# Patient Record
Sex: Female | Born: 1980 | Race: Black or African American | Hispanic: No | Marital: Single | Smoking: Never smoker
Health system: Southern US, Community
[De-identification: ages and names within clinical notes are randomized; demographics above are authoritative.]

## PROBLEM LIST (undated history)

## (undated) DIAGNOSIS — D649 Anemia, unspecified: Secondary | ICD-10-CM

## (undated) DIAGNOSIS — I10 Essential (primary) hypertension: Secondary | ICD-10-CM

## (undated) HISTORY — PX: HIP SURGERY: SHX245

## (undated) HISTORY — DX: Anemia, unspecified: D64.9

---

## 2005-08-23 ENCOUNTER — Emergency Department (HOSPITAL_COMMUNITY): Admission: EM | Admit: 2005-08-23 | Discharge: 2005-08-23 | Payer: Self-pay | Admitting: Emergency Medicine

## 2006-06-07 ENCOUNTER — Emergency Department (HOSPITAL_COMMUNITY): Admission: EM | Admit: 2006-06-07 | Discharge: 2006-06-07 | Payer: Self-pay | Admitting: Emergency Medicine

## 2006-12-13 ENCOUNTER — Emergency Department (HOSPITAL_COMMUNITY): Admission: EM | Admit: 2006-12-13 | Discharge: 2006-12-13 | Payer: Self-pay | Admitting: Emergency Medicine

## 2007-03-12 ENCOUNTER — Emergency Department (HOSPITAL_COMMUNITY): Admission: EM | Admit: 2007-03-12 | Discharge: 2007-03-13 | Payer: Self-pay | Admitting: Emergency Medicine

## 2010-02-19 ENCOUNTER — Ambulatory Visit (HOSPITAL_COMMUNITY): Admission: RE | Admit: 2010-02-19 | Discharge: 2010-02-19 | Payer: Self-pay | Admitting: Obstetrics and Gynecology

## 2010-03-02 ENCOUNTER — Ambulatory Visit (HOSPITAL_COMMUNITY): Admission: RE | Admit: 2010-03-02 | Discharge: 2010-03-02 | Payer: Self-pay | Admitting: Obstetrics and Gynecology

## 2010-03-16 ENCOUNTER — Ambulatory Visit (HOSPITAL_COMMUNITY): Admission: RE | Admit: 2010-03-16 | Discharge: 2010-03-16 | Payer: Self-pay | Admitting: Obstetrics and Gynecology

## 2010-04-08 ENCOUNTER — Ambulatory Visit (HOSPITAL_COMMUNITY): Admission: RE | Admit: 2010-04-08 | Discharge: 2010-04-08 | Payer: Self-pay | Admitting: Obstetrics and Gynecology

## 2010-05-06 ENCOUNTER — Ambulatory Visit (HOSPITAL_COMMUNITY): Admission: RE | Admit: 2010-05-06 | Discharge: 2010-05-06 | Payer: Self-pay | Admitting: Obstetrics and Gynecology

## 2010-06-17 ENCOUNTER — Ambulatory Visit (HOSPITAL_COMMUNITY): Admission: RE | Admit: 2010-06-17 | Discharge: 2010-06-17 | Payer: Self-pay | Admitting: Obstetrics and Gynecology

## 2010-06-19 ENCOUNTER — Emergency Department (HOSPITAL_COMMUNITY): Admission: EM | Admit: 2010-06-19 | Discharge: 2010-06-19 | Payer: Self-pay | Admitting: Emergency Medicine

## 2010-07-08 ENCOUNTER — Ambulatory Visit (HOSPITAL_COMMUNITY): Admission: RE | Admit: 2010-07-08 | Discharge: 2010-07-08 | Payer: Self-pay | Admitting: Obstetrics and Gynecology

## 2010-08-05 ENCOUNTER — Ambulatory Visit (HOSPITAL_COMMUNITY): Admission: RE | Admit: 2010-08-05 | Discharge: 2010-08-05 | Payer: Self-pay | Admitting: Obstetrics and Gynecology

## 2010-08-13 ENCOUNTER — Inpatient Hospital Stay (HOSPITAL_COMMUNITY): Admission: AD | Admit: 2010-08-13 | Discharge: 2010-08-13 | Payer: Self-pay | Admitting: Obstetrics and Gynecology

## 2010-08-22 ENCOUNTER — Inpatient Hospital Stay (HOSPITAL_COMMUNITY): Admission: AD | Admit: 2010-08-22 | Discharge: 2010-08-22 | Payer: Self-pay | Admitting: Obstetrics and Gynecology

## 2010-08-23 ENCOUNTER — Ambulatory Visit (HOSPITAL_COMMUNITY): Admission: RE | Admit: 2010-08-23 | Discharge: 2010-08-23 | Payer: Self-pay | Admitting: Obstetrics and Gynecology

## 2010-08-24 ENCOUNTER — Inpatient Hospital Stay (HOSPITAL_COMMUNITY)
Admission: AD | Admit: 2010-08-24 | Discharge: 2010-08-29 | Payer: Self-pay | Source: Home / Self Care | Admitting: Obstetrics and Gynecology

## 2010-08-26 ENCOUNTER — Encounter: Payer: Self-pay | Admitting: Obstetrics and Gynecology

## 2010-08-26 ENCOUNTER — Encounter (INDEPENDENT_AMBULATORY_CARE_PROVIDER_SITE_OTHER): Payer: Self-pay | Admitting: Obstetrics and Gynecology

## 2010-11-28 ENCOUNTER — Encounter: Payer: Self-pay | Admitting: Obstetrics and Gynecology

## 2010-11-29 ENCOUNTER — Encounter: Payer: Self-pay | Admitting: Obstetrics and Gynecology

## 2011-01-20 LAB — COMPREHENSIVE METABOLIC PANEL
ALT: 13 U/L (ref 0–35)
ALT: 13 U/L (ref 0–35)
ALT: 13 U/L (ref 0–35)
AST: 16 U/L (ref 0–37)
AST: 16 U/L (ref 0–37)
Albumin: 2.6 g/dL — ABNORMAL LOW (ref 3.5–5.2)
Albumin: 2.6 g/dL — ABNORMAL LOW (ref 3.5–5.2)
Albumin: 2.6 g/dL — ABNORMAL LOW (ref 3.5–5.2)
Alkaline Phosphatase: 89 U/L (ref 39–117)
Alkaline Phosphatase: 89 U/L (ref 39–117)
Alkaline Phosphatase: 89 U/L (ref 39–117)
BUN: 5 mg/dL — ABNORMAL LOW (ref 6–23)
BUN: 5 mg/dL — ABNORMAL LOW (ref 6–23)
BUN: 7 mg/dL (ref 6–23)
CO2: 24 mEq/L (ref 19–32)
CO2: 25 mEq/L (ref 19–32)
Calcium: 9.1 mg/dL (ref 8.4–10.5)
Calcium: 9.5 mg/dL (ref 8.4–10.5)
Chloride: 102 mEq/L (ref 96–112)
Chloride: 105 mEq/L (ref 96–112)
Chloride: 105 mEq/L (ref 96–112)
Creatinine, Ser: 0.59 mg/dL (ref 0.4–1.2)
Creatinine, Ser: 0.64 mg/dL (ref 0.4–1.2)
GFR calc Af Amer: >60 mL/min (ref >60)
GFR calc Af Amer: >60 mL/min (ref >60)
GFR calc non Af Amer: >60 mL/min (ref >60)
GFR calc non Af Amer: >60 mL/min (ref >60)
Glucose, Bld: 87 mg/dL (ref 70–99)
Glucose, Bld: 88 mg/dL (ref 70–99)
Glucose, Bld: 96 mg/dL (ref 70–99)
Potassium: 3.8 mEq/L (ref 3.5–5.1)
Potassium: 3.9 mEq/L (ref 3.5–5.1)
Potassium: 4 mEq/L (ref 3.5–5.1)
Sodium: 135 mEq/L (ref 135–145)
Sodium: 135 mEq/L (ref 135–145)
Sodium: 137 mEq/L (ref 135–145)
Total Bilirubin: 0.3 mg/dL (ref 0.3–1.2)
Total Bilirubin: 0.4 mg/dL (ref 0.3–1.2)
Total Bilirubin: <0.1 mg/dL — ABNORMAL LOW (ref 0.3–1.2)
Total Protein: 6 g/dL (ref 6.0–8.3)
Total Protein: 6.6 g/dL (ref 6.0–8.3)
Total Protein: 6.6 g/dL (ref 6.0–8.3)

## 2011-01-20 LAB — PROTEIN, URINE, 24 HOUR
Collection Interval-UPROT: 24 hours
Protein, 24H Urine: 198 mg/d — ABNORMAL HIGH (ref 50–100)
Protein, Urine: 7 mg/dL
Urine Total Volume-UPROT: 2825 mL

## 2011-01-20 LAB — CBC
HCT: 28.7 % — ABNORMAL LOW (ref 36.0–46.0)
HCT: 30.3 % — ABNORMAL LOW (ref 36.0–46.0)
HCT: 30.5 % — ABNORMAL LOW (ref 36.0–46.0)
HCT: 31.1 % — ABNORMAL LOW (ref 36.0–46.0)
Hemoglobin: 10.1 g/dL — ABNORMAL LOW (ref 12.0–15.0)
Hemoglobin: 10.4 g/dL — ABNORMAL LOW (ref 12.0–15.0)
MCH: 25.7 pg — ABNORMAL LOW (ref 26.0–34.0)
MCH: 25.9 pg — ABNORMAL LOW (ref 26.0–34.0)
MCHC: 33 g/dL (ref 30.0–36.0)
MCHC: 33.3 g/dL (ref 30.0–36.0)
MCHC: 33.5 g/dL (ref 30.0–36.0)
MCV: 77.3 fL — ABNORMAL LOW (ref 78.0–100.0)
MCV: 77.9 fL — ABNORMAL LOW (ref 78.0–100.0)
MCV: 78.2 fL (ref 78.0–100.0)
MCV: 78.9 fL (ref 78.0–100.0)
Platelets: 276 10*3/uL (ref 150–400)
Platelets: 293 10*3/uL (ref 150–400)
Platelets: 295 10*3/uL (ref 150–400)
Platelets: 300 10*3/uL (ref 150–400)
RBC: 3.87 MIL/uL (ref 3.87–5.11)
RBC: 3.92 MIL/uL (ref 3.87–5.11)
RBC: 4.02 MIL/uL (ref 3.87–5.11)
RDW: 15.6 % — ABNORMAL HIGH (ref 11.5–15.5)
RDW: 15.8 % — ABNORMAL HIGH (ref 11.5–15.5)
RDW: 15.8 % — ABNORMAL HIGH (ref 11.5–15.5)
RDW: 16 % — ABNORMAL HIGH (ref 11.5–15.5)
WBC: 5.5 10*3/uL (ref 4.0–10.5)
WBC: 6.4 10*3/uL (ref 4.0–10.5)
WBC: 7.1 10*3/uL (ref 4.0–10.5)

## 2011-01-20 LAB — CREATININE CLEARANCE, URINE, 24 HOUR
Collection Interval-CRCL: 24 hours
Creatinine Clearance: 259 mL/min — ABNORMAL HIGH (ref 75–115)
Creatinine, 24H Ur: 2204 mg/d — ABNORMAL HIGH (ref 700–1800)
Creatinine, Urine: 78 mg/dL
Creatinine: 0.59 mg/dL (ref 0.4–1.2)
Urine Total Volume-CRCL: 2825 mL

## 2011-01-20 LAB — URIC ACID
Uric Acid, Serum: 6.8 mg/dL (ref 2.4–7.0)
Uric Acid, Serum: 6.9 mg/dL (ref 2.4–7.0)

## 2011-01-20 LAB — LACTATE DEHYDROGENASE: LDH: 187 U/L (ref 94–250)

## 2011-01-21 LAB — URINALYSIS, ROUTINE W REFLEX MICROSCOPIC
Ketones, ur: NEGATIVE mg/dL
Nitrite: NEGATIVE
pH: 6 (ref 5.0–8.0)

## 2011-01-21 LAB — CBC
MCH: 25.8 pg — ABNORMAL LOW (ref 26.0–34.0)
MCHC: 32.8 g/dL (ref 30.0–36.0)
Platelets: 327 10*3/uL (ref 150–400)
RDW: 14.6 % (ref 11.5–15.5)

## 2011-01-21 LAB — COMPREHENSIVE METABOLIC PANEL
AST: 18 U/L (ref 0–37)
Albumin: 2.6 g/dL — ABNORMAL LOW (ref 3.5–5.2)
CO2: 22 mEq/L (ref 19–32)
Calcium: 9 mg/dL (ref 8.4–10.5)
Creatinine, Ser: 0.56 mg/dL (ref 0.4–1.2)
GFR calc Af Amer: >60 mL/min (ref >60)
GFR calc non Af Amer: >60 mL/min (ref >60)
Sodium: 133 mEq/L — ABNORMAL LOW (ref 135–145)
Total Protein: 6.5 g/dL (ref 6.0–8.3)

## 2011-01-21 LAB — URIC ACID: Uric Acid, Serum: 6.4 mg/dL (ref 2.4–7.0)

## 2011-01-21 LAB — URINE MICROSCOPIC-ADD ON

## 2011-10-05 IMAGING — US US OB FOLLOW-UP
2 series · 14 of 28 positions shown · non-contrast
Comparison: none

OBSTETRICAL ULTRASOUND:
 This ultrasound was performed in The [HOSPITAL], and the [facility] OB/GYN report will be stored to [REDACTED] PACS.  This report is also available in [HOSPITAL]?s accessANYware.

[Series 1: us ob follow-up · 0.24mm/px · 11 of 23 slices shown (1 of 2)]
[im 2/23]
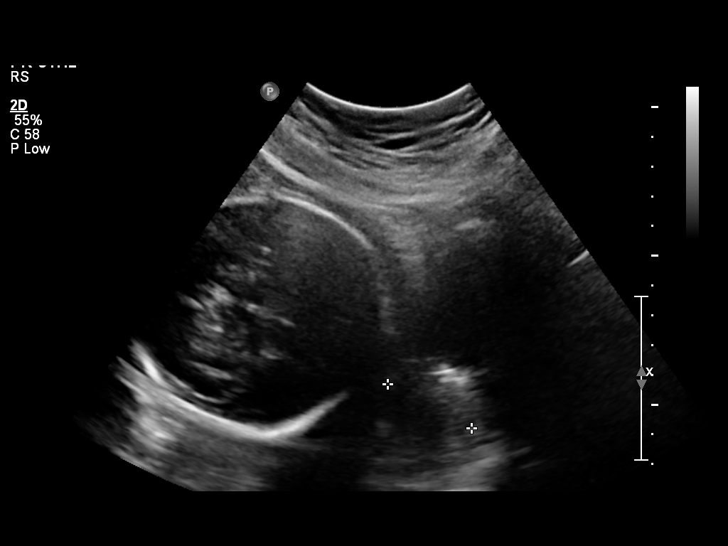
[im 4/23]
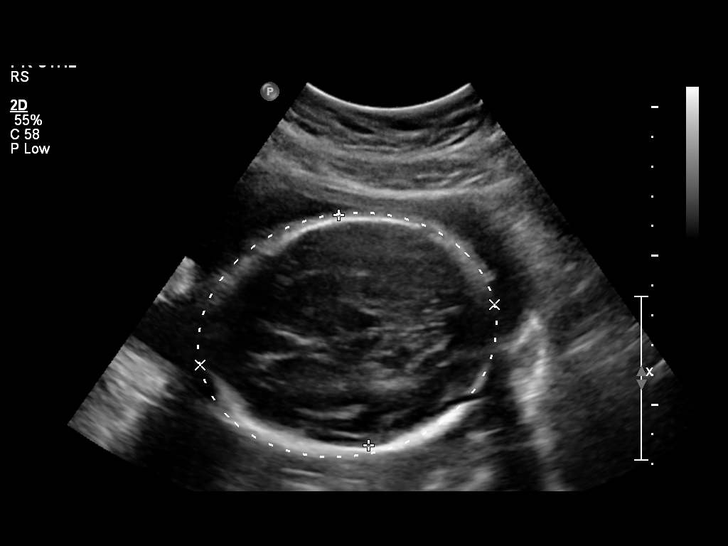
[im 6/23]
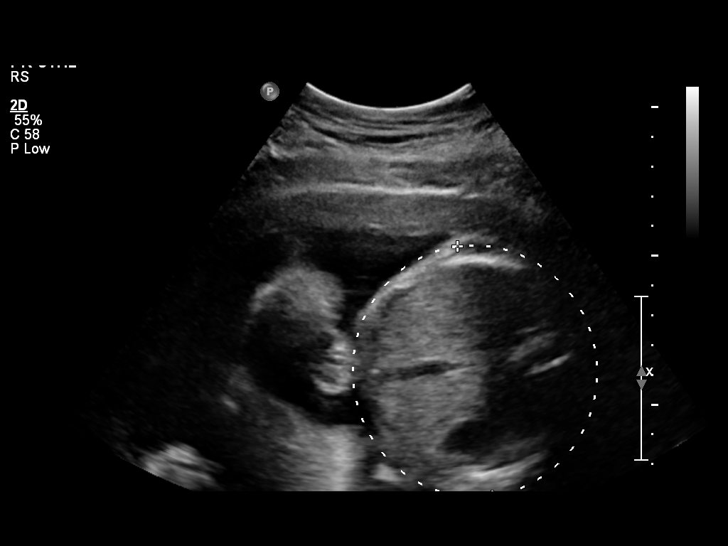
[im 8/23]
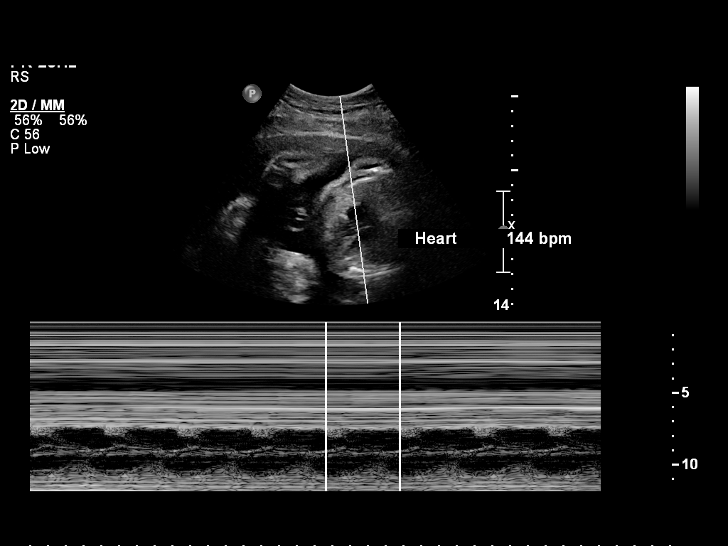
[im 10/23]
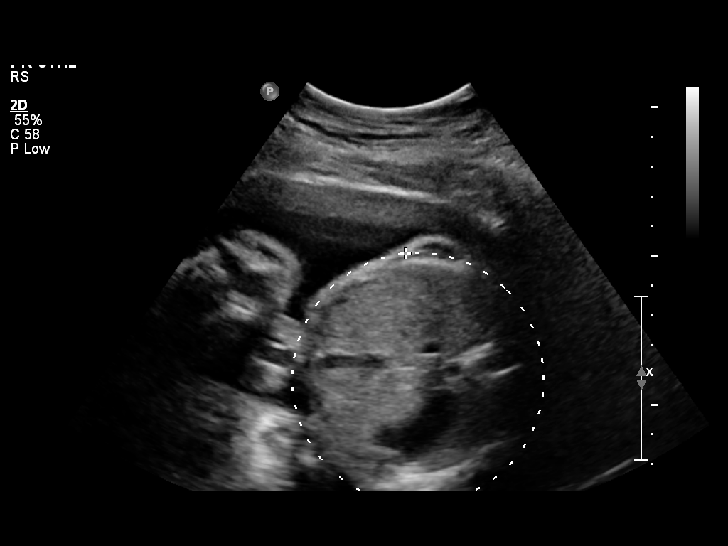
[im 12/23]
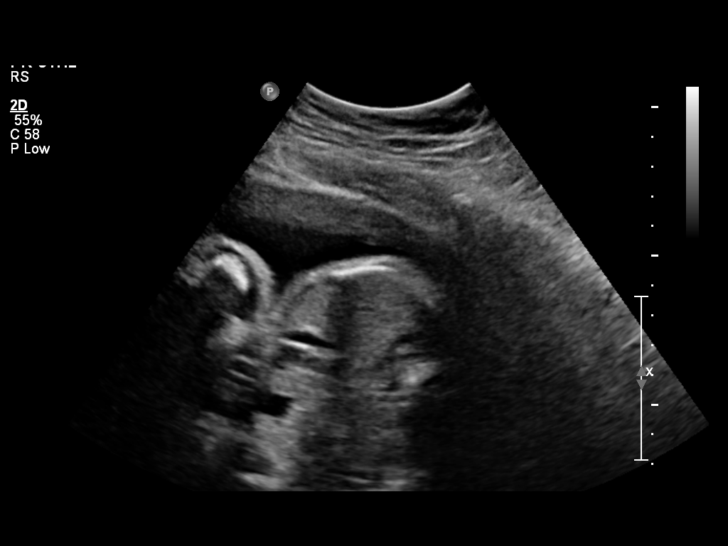
[im 14/23]
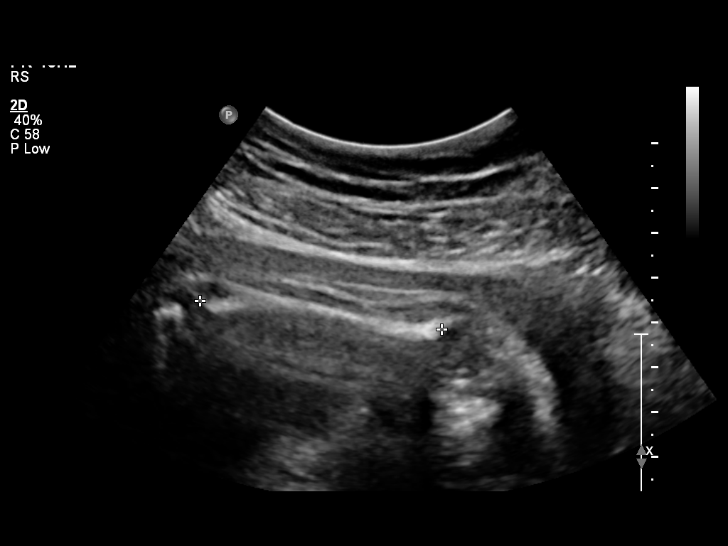
[im 16/23]
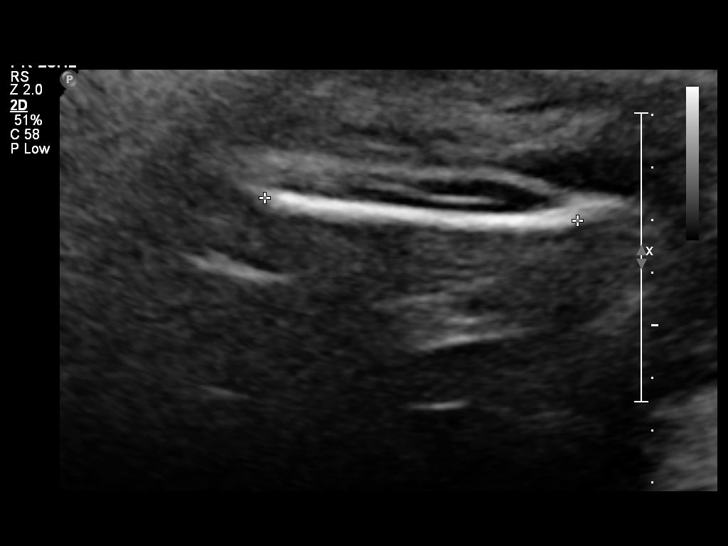
[im 18/23]
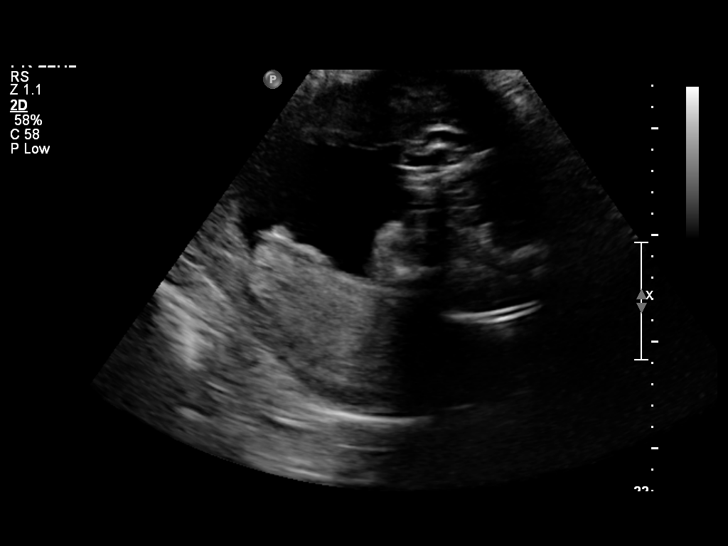
[im 20/23]
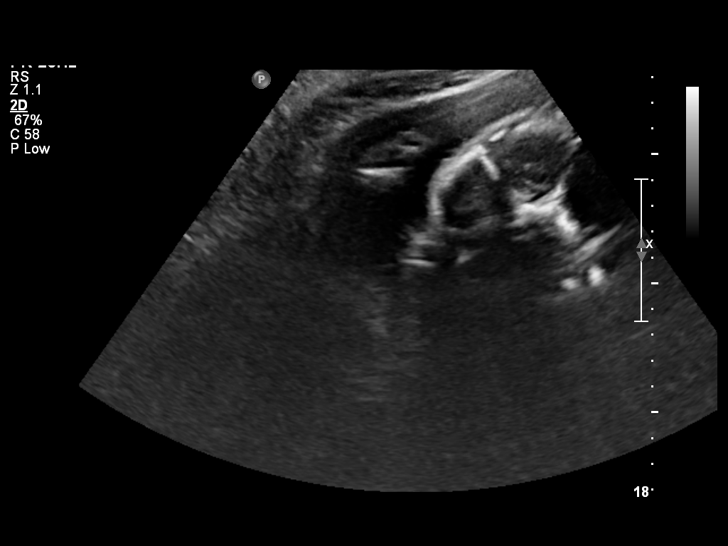
[im 22/23]
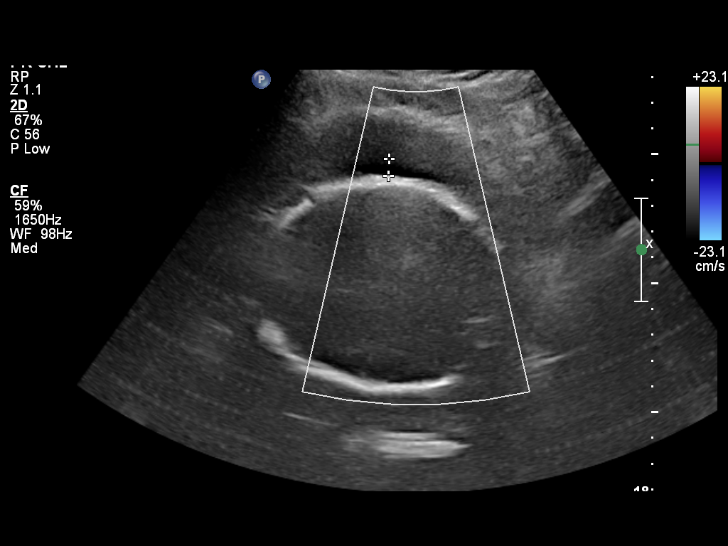

[Series 1: us ob follow-up · 0.27mm/px · 3 of 5 slices shown (2 of 2)]
[im 1/5]
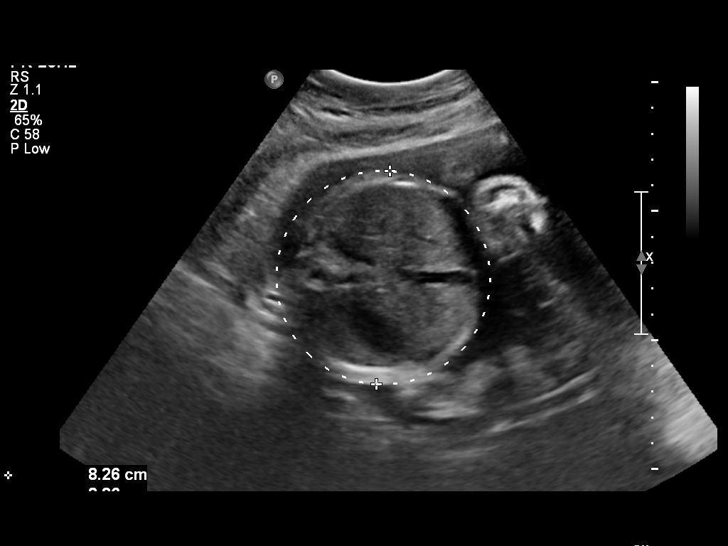
[im 3/5]
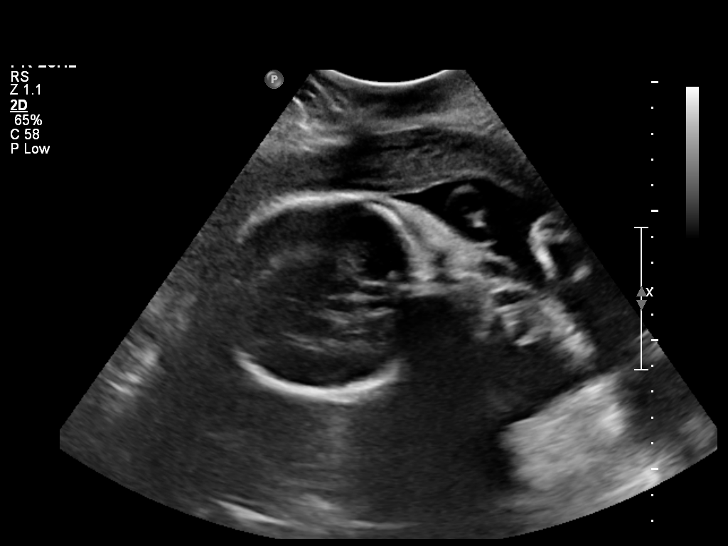
[im 5/5]
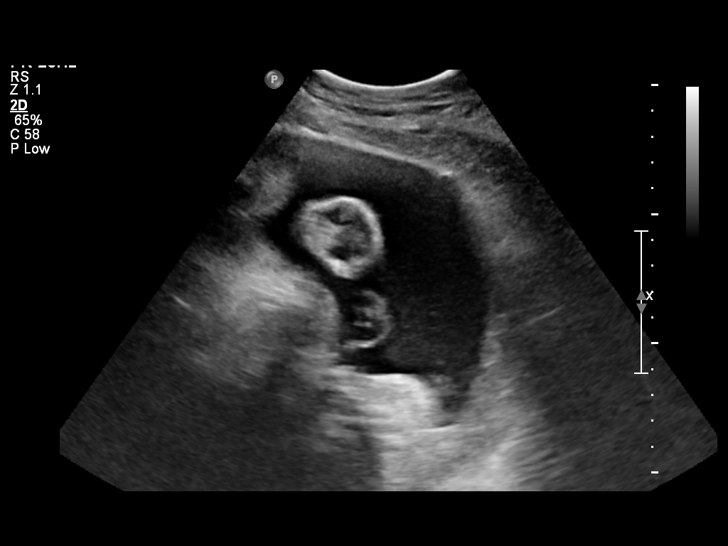

[14 of 28 positions shown; findings below may reference images not displayed]

IMPRESSION: [facility] OB/GYN has also been faxed to the ordering physician.

## 2012-12-18 ENCOUNTER — Emergency Department (HOSPITAL_COMMUNITY)
Admission: EM | Admit: 2012-12-18 | Discharge: 2012-12-18 | Disposition: A | Discharge status: Home / Self Care | Payer: Self-pay | Attending: Emergency Medicine | Admitting: Emergency Medicine

## 2012-12-18 ENCOUNTER — Encounter (HOSPITAL_COMMUNITY): Payer: Self-pay | Admitting: Emergency Medicine

## 2012-12-18 DIAGNOSIS — Z3202 Encounter for pregnancy test, result negative: Secondary | ICD-10-CM | POA: Insufficient documentation

## 2012-12-18 DIAGNOSIS — N390 Urinary tract infection, site not specified: Secondary | ICD-10-CM

## 2012-12-18 DIAGNOSIS — I10 Essential (primary) hypertension: Secondary | ICD-10-CM | POA: Insufficient documentation

## 2012-12-18 HISTORY — DX: Essential (primary) hypertension: I10

## 2012-12-18 LAB — URINALYSIS, MICROSCOPIC ONLY
Bilirubin Urine: NEGATIVE
Glucose, UA: NEGATIVE mg/dL
Ketones, ur: NEGATIVE mg/dL
Nitrite: NEGATIVE
pH: 7 (ref 5.0–8.0)

## 2012-12-18 LAB — POCT PREGNANCY, URINE: Preg Test, Ur: NEGATIVE

## 2012-12-18 MED ORDER — HYDROCODONE-ACETAMINOPHEN 5-325 MG PO TABS: 2.0000 | ORAL_TABLET | Freq: Four times a day (QID) | ORAL | Status: DC | PRN

## 2012-12-18 MED ORDER — CIPROFLOXACIN HCL 500 MG PO TABS: 500.0000 mg | ORAL_TABLET | Freq: Two times a day (BID) | ORAL | Status: DC

## 2012-12-18 NOTE — ED Notes (Signed)
Pt c/o sharp abd pain LLQ, pt sts she may have a uti.VSS

## 2012-12-18 NOTE — ED Provider Notes (Signed)
History     CSN: [id number]  Arrival date & time 12/18/12  1845   First MD Initiated Contact with Patient 12/18/12 2011      Chief Complaint  Patient presents with  . Abdominal Pain    (Consider location/radiation/quality/duration/timing/severity/associated sxs/prior treatment) HPI Comments: Patient thinks she may have a uti.  No vaginal discharge or bleeding.  No fevers or chills.  Patient is a 32 y.o. female presenting with abdominal pain. The history is provided by the patient.  Abdominal Pain Pain location:  LLQ Pain quality: burning   Pain radiates to:  Does not radiate Pain severity:  Moderate Onset quality:  Gradual Timing:  Constant Chronicity:  New Relieved by:  Nothing Worsened by:  Urination Ineffective treatments:  None tried   Past Medical History  Diagnosis Date  . Hypertension     History reviewed. No pertinent past surgical history.  No family history on file.  History  Substance Use Topics  . Smoking status: Never Smoker   . Smokeless tobacco: Not on file  . Alcohol Use: No    OB History   Grav Para Term Preterm Abortions TAB SAB Ect Mult Living                  Review of Systems  Gastrointestinal: Positive for abdominal pain.  All other systems reviewed and are negative.    Allergies  Review of patient's allergies indicates no known allergies.  Home Medications   Current Outpatient Rx  Name  Route  Sig  Dispense  Refill  . ferrous sulfate 325 (65 FE) MG tablet   Oral   Take 325 mg by mouth daily with breakfast.           BP 140/96  Pulse 113  Temp(Src) 98.4 F (36.9 C) (Oral)  Ht 5\' 6"  (1.676 m)  Wt 411 lb (186.428 kg)  BMI 66.37 kg/m2  SpO2 98%  LMP 11/14/2012  Physical Exam  Nursing note and vitals reviewed. Constitutional: She is oriented to person, place, and time. She appears well-developed and well-nourished. No distress.  HENT:  Head: Normocephalic and atraumatic.  Mouth/Throat: Oropharynx is clear and  moist.  Neck: Normal range of motion. Neck supple.  Abdominal: Soft. Bowel sounds are normal. She exhibits no distension. There is no tenderness.  Patient is morbidly obese.  Exam difficult but seems benign.  There is mild LLQ ttp without rebound or guarding.  Musculoskeletal: Normal range of motion. She exhibits no edema.  Neurological: She is alert and oriented to person, place, and time.  Skin: Skin is warm and dry. She is not diaphoretic.    ED Course  Procedures (including critical care time)  Labs Reviewed  URINALYSIS, MICROSCOPIC ONLY  POCT PREGNANCY, URINE   No results found.   No diagnosis found.    MDM  UA shows uti.  She has had these in the past and this feels similar.  Will treat with antibiotics, pain meds, return prn.        Geoffery Lyons, MD 12/18/12 2130

## 2012-12-18 NOTE — ED Notes (Signed)
WBC count 3-6.  No RBC.

## 2012-12-18 NOTE — ED Notes (Signed)
Informed MD.

## 2012-12-20 LAB — URINE CULTURE: Colony Count: >=100000

## 2012-12-21 ENCOUNTER — Telehealth (HOSPITAL_COMMUNITY): Payer: Self-pay | Admitting: Emergency Medicine

## 2012-12-21 NOTE — ED Notes (Signed)
Positive urnc- treated with cipro sensitive to same. No further follow-up needed at this time

## 2013-01-03 ENCOUNTER — Ambulatory Visit: Payer: Self-pay | Admitting: Obstetrics and Gynecology

## 2013-05-05 ENCOUNTER — Emergency Department (HOSPITAL_COMMUNITY)
Admission: EM | Admit: 2013-05-05 | Discharge: 2013-05-05 | Disposition: A | Discharge status: Home / Self Care | Payer: Self-pay | Attending: Emergency Medicine | Admitting: Emergency Medicine

## 2013-05-05 ENCOUNTER — Emergency Department (HOSPITAL_COMMUNITY): Payer: Self-pay

## 2013-05-05 ENCOUNTER — Encounter (HOSPITAL_COMMUNITY): Payer: Self-pay | Admitting: Emergency Medicine

## 2013-05-05 DIAGNOSIS — I10 Essential (primary) hypertension: Secondary | ICD-10-CM | POA: Insufficient documentation

## 2013-05-05 DIAGNOSIS — M25559 Pain in unspecified hip: Secondary | ICD-10-CM | POA: Insufficient documentation

## 2013-05-05 DIAGNOSIS — M25551 Pain in right hip: Secondary | ICD-10-CM

## 2013-05-05 DIAGNOSIS — G8918 Other acute postprocedural pain: Secondary | ICD-10-CM | POA: Insufficient documentation

## 2013-05-05 MED ORDER — IBUPROFEN 800 MG PO TABS: 800.0000 mg | ORAL_TABLET | Freq: Three times a day (TID) | ORAL | Status: DC

## 2013-05-05 NOTE — ED Notes (Signed)
Pt c/o L hip pain, pt states she has begun exercising and feels this has caused the new pain. Pt does have hardware in bilat hips.

## 2013-05-05 NOTE — Discharge Instructions (Signed)
Please obtain a primary care doctor from list below. Please use RICE method for hip pain listed below. Please read all discharge instructions and return precautions.   Hip Pain The hips join the upper legs to the lower pelvis. The bones, cartilage, tendons, and muscles of the hip joint perform a lot of work each day holding your body weight and allowing you to move around. Hip pain is a common symptom. It can range from a minor ache to severe pain on 1 or both hips. Pain may be felt on the inside of the hip joint near the groin, or the outside near the buttocks and upper thigh. There may be swelling or stiffness as well. It occurs more often when a person walks or performs activity. There are many reasons hip pain can develop. CAUSES  It is important to work with your caregiver to identify the cause since many conditions can impact the bones, cartilage, muscles, and tendons of the hips. Causes for hip pain include:  Broken (fractured) bones.  Separation of the thighbone from the hip socket (dislocation).  Torn cartilage of the hip joint.  Swelling (inflammation) of a tendon (tendonitis), the sac within the hip joint (bursitis), or a joint.  A weakening in the abdominal wall (hernia), affecting the nerves to the hip.  Arthritis in the hip joint or lining of the hip joint.  Pinched nerves in the back, hip, or upper thigh.  A bulging disc in the spine (herniated disc).  Rarely, bone infection or cancer. DIAGNOSIS  The location of your hip pain will help your caregiver understand what may be causing the pain. A diagnosis is based on your medical history, your symptoms, results from your physical exam, and results from diagnostic tests. Diagnostic tests may include X-ray exams, a computerized magnetic scan (magnetic resonance imaging, MRI), or bone scan. TREATMENT  Treatment will depend on the cause of your hip pain. Treatment may include:  Limiting activities and resting until symptoms  improve.  Crutches or other walking supports (a cane or brace).  Ice, elevation, and compression.  Physical therapy or home exercises.  Shoe inserts or special shoes.  Losing weight.  Medications to reduce pain.  Undergoing surgery. HOME CARE INSTRUCTIONS   Only take over-the-counter or prescription medicines for pain, discomfort, or fever as directed by your caregiver.  Put ice on the injured area:  Put ice in a plastic bag.  Place a towel between your skin and the bag.  Leave the ice on for 15-20 minutes at a time, 3-4 times a day.  Keep your leg raised (elevated) when possible to lessen swelling.  Avoid activities that cause pain.  Follow specific exercises as directed by your caregiver.  Sleep with a pillow between your legs on your most comfortable side.  Record how often you have hip pain, the location of the pain, and what it feels like. This information may be helpful to you and your caregiver.  Ask your caregiver about returning to work or sports and whether you should drive.  Follow up with your caregiver for further exams, therapy, or testing as directed. SEEK MEDICAL CARE IF:   Your pain or swelling continues or worsens after 1 week.  You are feeling unwell or have chills.  You have increasing difficulty with walking.  You have a loss of sensation or other new symptoms.  You have questions or concerns. SEEK IMMEDIATE MEDICAL CARE IF:   You cannot put weight on the affected hip.  You have  fallen.  You have a sudden increase in pain and swelling in your hip.  You have a fever. MAKE SURE YOU:   Understand these instructions.  Will watch your condition.  Will get help right away if you are not doing well or get worse. Document Released: 04/14/2010 Document Revised: 01/17/2012 Document Reviewed: 04/14/2010 [facility] Patient Information 2014 [place], [place]. RICE: Routine Care for Injuries The routine care of many injuries includes Rest, Ice,  Compression, and Elevation (RICE). HOME CARE INSTRUCTIONS  Rest is needed to allow your body to heal. Routine activities can usually be resumed when comfortable. Injured tendons and bones can take up to 6 weeks to heal. Tendons are the cord-like structures that attach muscle to bone.  Ice following an injury helps keep the swelling down and reduces pain.  Put ice in a plastic bag.  Place a towel between your skin and the bag.  Leave the ice on for 15-20 minutes, 3-4 times a day. Do this while awake, for the first 24 to 48 hours. After that, continue as directed by your caregiver.  Compression helps keep swelling down. It also gives support and helps with discomfort. If an elastic bandage has been applied, it should be removed and reapplied every 3 to 4 hours. It should not be applied tightly, but firmly enough to keep swelling down. Watch fingers or toes for swelling, bluish discoloration, coldness, numbness, or excessive pain. If any of these problems occur, remove the bandage and reapply loosely. Contact your caregiver if these problems continue.  Elevation helps reduce swelling and decreases pain. With extremities, such as the arms, hands, legs, and feet, the injured area should be placed near or above the level of the heart, if possible. SEEK IMMEDIATE MEDICAL CARE IF:  You have persistent pain and swelling.  You develop redness, numbness, or unexpected weakness.  Your symptoms are getting worse rather than improving after several days. These symptoms may indicate that further evaluation or further X-rays are needed. Sometimes, X-rays may not show a small broken bone (fracture) until 1 week or 10 days later. Make a follow-up appointment with your caregiver. Ask when your X-ray results will be ready. Make sure you get your X-ray results. Document Released: 02/06/2001 Document Revised: 01/17/2012 Document Reviewed: 03/26/2011 [facility] Patient Information 2014 [place], [place].  RESOURCE  GUIDE  Chronic Pain Problems: Contact [facility]  [phone] Patients need to be referred by their primary care doctor.  Insufficient Money for Medicine: Contact United Way:  call "211."   No Primary Care Doctor: - Call Health Connect  [phone] - can help you locate a primary care doctor that  accepts your insurance, provides certain services, etc. - Physician Referral Service- [phone]  Agencies that provide inexpensive medical care: - [facility]  [phone] - [facility]  [phone] - [facility]  [phone] - Women's Clinic  [phone] - Planned Parenthood  [phone] [facility]  [phone]  Medicaid-accepting [facility] Providers: - [facility]- [address]  [phone], Mon-Fri 9am-7pm, Sat 9am-1pm - [facility]- [address] [place], Suite [place]  [phone] - [facility]- [address] [place]  [phone] [facility]- [address]  [phone] - Renaye Rakers- [address] [place], [address], [phone]  Only accepts [place] patients after they have their name  applied to their card  Self Pay (no insurance)  in [facility]: - Sickle Cell Patients - [facility]  [address] [place], [phone] - [facility]- [address] [place]  [phone]       [facility]rc[facility] [place]- [address]       -     [facility]- see information above (Speak to [employer] if you do not have insurance)       -  [facility]- [address],  [phone]       -  [facility]- [address], [phone]       -  Dr Julio Sicks-  [address], [place], [phone]       -  [facility] - [address] [phone]       -  [facility]- [address], [phone], also [address], [phone]       -     [facility]- [address] [place], [phone], 1st & 3rd Saturday         every month, 10am-1pm  -     [facility] and [facility]   [address] [place], [place].   Phone:  [phone], Fax:  [phone]. Hours of Operation:  9 am - 6 pm, M-F.  -     [facility]   [address], [place]   Phone: [phone], Fax: [phone]. Hours of Operation:  8:30 am - 5:30 pm, M-F.  [facility] [address] [place], [place] [address] [phone]  [facility] [address] [place], [place] [address] [phone]  1) Find a Doctor and Pay Out of Pocket Although you won't have to find out who is covered by your insurance plan, it is a good idea to ask around and get recommendations. You will then need to call the office and see if the doctor you have chosen will accept you as a new patient and what types of options they offer for patients who are self-pay. Some doctors offer discounts or will set up payment plans for their patients who do not have insurance, but you will need to ask so you aren't surprised when you get to your appointment.  2) Contact Your Local Health Department Not all health departments have doctors that can see patients for sick visits, but many do, so it is worth a call to see if yours does. If you don't know where your local health department is, you can check in your phone book. The CDC also has a tool to help you locate your state's health department, and many state websites also have listings of all of their local health departments.  3) Find a Walk-in Clinic If your illness is not likely to be very severe or complicated, you may want to try a walk in clinic. These are popping up all over the country in pharmacies, drugstores, and shopping centers. They're usually staffed by nurse practitioners or physician assistants that have been trained to treat  common illnesses and complaints. They're usually fairly quick and inexpensive. However, if you have serious medical issues or chronic medical problems, these are probably not your best option  STD Testing - [facility] Department of [facility], STD Clinic, [address] [place], phone [phone] or [phone].  Monday - Friday,  call for an appointment. [facility] Department of [employer], STD Clinic, [address], [place], phone [phone] or [phone].  Monday - Friday, call for an appointment.  Abuse/Neglect: [facility] Child Abuse Hotline 612-237-9259 [facility] Child Abuse Hotline 9490705443 (After Hours)  Emergency Shelter:  [facility] [phone]  Maternity Homes: - Room at the [place] [facility] [phone] - [facility] [phone]  MRSA Hotline #:   2810962905  Dental Assistance If unable to pay or uninsured, contact:  [facility]. to become qualified for the adult dental clinic.  Patients with Medicaid: [facility] [phone] [address], [phone] [address] [phone]  If unable to pay, or uninsured, contact [facility] [phone] in [place], [phone] in [facility]) to become qualified for the adult dental clinic  [facility] [address] [place], [place] [address] [phone] [email or web address]  Other Proofreader Services: - [facility]- [address] [place], [place], [place], [address], [phone], Ext. [phone], 2nd and 4th Thursday of the month at 6:30am.  10 clients each day by appointment, can sometimes see walk-in patients if someone does not show for an appointment. [facility]- [address] [place]ins [place], [place], [address], [phone] - [facility] [address], [place], [place], [address], [phone] - [facility]  Department- [phone] [facility] Health Department- [phone] [facility] Health [phone]       Behavioral Health Resources in the [facility]  Intensive Outpatient Programs: [facility]      [address] [place], [place] [phone] Both a day and evening program       [facility] Outpatient     [address]        [place], [place] [address] [phone]         ADS: Alcohol [facility] Drug S[facility] [address] [place] [phone]  [facility] Mental Health ACCESS LINE: 601-617-7776 or [phone] [address] [place], [place] [address] [email or web address]   Substance Abuse Resources: - [facility] Drug [facility]  [phone] - [facility] [phone] - [facility] [place] [phone] Floydene Flock [phone] - Residential & Outpatient Substance Abuse Program  [phone]  Psychological Services: [facility]  [phone] [facility]  [phone] - [facility], [phone] [place]. [address], [place], ACCESS LINE: 762-141-4876 or (801)[phone], [email or web address]  Mobile Crisis Teams:                                        Therapeutic Alternatives         Mobile Crisis Care Unit 2284411599             [facility] [address] Ginette Otto [phone]                                         Interventionist [address] [place] [phone]  Self-Help/Support Groups: Mental Health Assoc. of [employer] of support groups (212) 774-3902 (call for more info)  Narcotics Anonymous (NA) Caring Services [address] [place] - 2 meetings at this location  Residential Treatment Programs:  [facility]      [address]        [place]       [phone]         [facility] [address], [place] [address] [place], [place]  [address] [phone]  [facility] Treatment Facility  [address] [place], [place] [address] [phone] Admissions: 8am-3pm M-F  [facility]     [address]        [place], [place] [address]       [phone]         [facility] [address] [place]ling Manns [place], [place] [phone]  [facility] [address] [place], [place] [phone]  [facility] - Intensive Outpatient      [address]     [place], [place]       [phone]         [facility] (Add[facility]     [address] [place], [place] [phone] or [phone]  Residential Treatment Services (RTS), Medicaid [address] [place], [place] [phone]  Fellowship [address]                                               [address] [place] [phone]  [facility] Resources: [phone]               General Therapy                                                Angie Fava, PhD        [address] [place], [place] [address]         [phone]   Insurance  [facility] Behavioral   [address] [place], [place] [address] [phone]  [facility] [address] [place], [place] [address] [phone] Insurance/Medicaid/sponsorship through [facility] and Families                                              [address]                                        [place], [place] [address]    Therapy/tele-psych/case         [phone]          [facility] [address], [place]  [address]  Adolescent/group home/case management [phone]                                           Creola Corn PhD  General therapy       Insurance   [phone]         Dr. Lolly Mustache, [place], M-F [phone]  [facility]  [employer] [facility] Dept. [address]                 [address]         [address] [place] [address]  [place]ondell Reveal Phone:  [phone]                                  Phone:  [phone]                   Phone:  [phone]  [facility] Mental Health, [phone] - [facility] - [facility]- [phone]       -     [facility] in [place], [address],             [phone], Insurance  [facility] Child Abuse Hotline 984 704 5277 or 8037186184 (After Hours)

## 2013-05-05 NOTE — ED Provider Notes (Signed)
History    CSN: [id number] Arrival date & time 05/05/13  7829  First MD Initiated Contact with Patient 05/05/13 [phone]     Chief Complaint  Patient presents with  . Hip Pain   (Consider location/radiation/quality/duration/timing/severity/associated sxs/prior Treatment) HPI Comments: Patient is a 32 year old female history significant for hypertension, bilateral hip surgery twenty years ago presenting to the emergency department complaining right hip pain began 2 weeks ago the patient started exercising for the first time in awhile. Patient describes pain as aching in nature w/o radiation. Pain is alleviated with rest and aggravated w/ ambulating. Rates pain 6/10. Denies any trauma or injury to hip. Denies fevers, chills, bladder or bowel incontinence, numbness or tingling in the lower extremities.   Past Medical History  Diagnosis Date  . Hypertension    Past Surgical History  Procedure Laterality Date  . Hip surgery Bilateral    No family history on file. History  Substance Use Topics  . Smoking status: Never Smoker   . Smokeless tobacco: Not on file  . Alcohol Use: No   OB History   Grav Para Term Preterm Abortions TAB SAB Ect Mult Living                 Review of Systems  Constitutional: Negative for fever and chills.  Respiratory: Negative for shortness of breath.   Cardiovascular: Negative for chest pain.  Musculoskeletal: Positive for myalgias and arthralgias. Negative for gait problem.    Allergies  Review of patient's allergies indicates no known allergies.  Home Medications   Current Outpatient Rx  Name  Route  Sig  Dispense  Refill  . Aspirin-Salicylamide-Caffeine (BC HEADACHE POWDER PO)   Oral   Take 1 Package by mouth daily as needed (headache).         Marland Kitchen ibuprofen (ADVIL,MOTRIN) 800 MG tablet   Oral   Take 1 tablet (800 mg total) by mouth 3 (three) times daily.   21 tablet   0    BP 135/78  Pulse 80  Temp(Src) 97.9 F (36.6 C) (Oral)  Resp  16  Ht 5\' 6"  (1.676 m)  Wt 429 lb (194.593 kg)  BMI 69.28 kg/m2  SpO2 100%  LMP 03/21/2013 Physical Exam  Constitutional: She is oriented to person, place, and time. She appears well-developed and well-nourished. No distress.  HENT:  Head: Normocephalic and atraumatic.  Eyes: Conjunctivae are normal.  Neck: Neck supple.  Pulmonary/Chest: Effort normal.  Musculoskeletal: Normal range of motion. She exhibits no tenderness.       Right hip: Normal.       Left hip: Normal.  Neurological: She is alert and oriented to person, place, and time.  Skin: Skin is warm and dry. She is not diaphoretic.  Psychiatric: She has a normal mood and affect.    ED Course  Procedures (including critical care time) Labs Reviewed - No data to display Dg Hip Complete Right  05/05/2013   *RADIOLOGY REPORT*  Clinical Data: Hip pain after exercise.  RIGHT HIP - COMPLETE 2+ VIEW  Comparison: None.  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  One transcervical screw is present on the right.  There are two on the left.  The right hip is located.  No acute bone or soft tissue abnormalities are present.  The pelvis is unremarkable.  IMPRESSION:  1.  Postsurgical changes of femoral necks bilaterally. 2.  No  acute abnormality.   Original Report Authenticated By: Marin Roberts, M.D.   1. Hip pain, acute, right     MDM  Patient with hip pain that began after patient began exercising for the first time in a long time. Patient's hip is nontender with full range of motion intact and is able to ambulate. X-ray revealed no acute abnormality. Patient advised that this is most likely due to musculoskeletal pain and typical soreness with first time exercising. Advised she should use Motrin for discomfort and stretch. Advised to obtain a primary care doctor for followup. Patient is agreeable to plan. Patient stable upon  discharge  Jeannetta Ellis, PA-C 05/05/13 1242

## 2013-05-05 NOTE — ED Notes (Signed)
Pt states she has had hip pain for a few weeks she has been working out 10/10  Pt is alert and oriented

## 2013-05-06 NOTE — ED Provider Notes (Signed)
Medical screening examination/treatment/procedure(s) were performed by non-physician practitioner and as supervising physician I was immediately available for consultation/collaboration.  Giorgio Chabot M Trellis Guirguis, MD 05/06/13 0026 

## 2013-09-03 ENCOUNTER — Encounter (HOSPITAL_COMMUNITY): Payer: Self-pay | Admitting: Emergency Medicine

## 2013-09-03 ENCOUNTER — Emergency Department (HOSPITAL_COMMUNITY)
Admission: EM | Admit: 2013-09-03 | Discharge: 2013-09-04 | Disposition: A | Discharge status: Home / Self Care | Payer: Self-pay | Attending: Emergency Medicine | Admitting: Emergency Medicine

## 2013-09-03 DIAGNOSIS — R5381 Other malaise: Secondary | ICD-10-CM | POA: Insufficient documentation

## 2013-09-03 DIAGNOSIS — R000 Tachycardia, unspecified: Secondary | ICD-10-CM | POA: Insufficient documentation

## 2013-09-03 DIAGNOSIS — I10 Essential (primary) hypertension: Secondary | ICD-10-CM | POA: Insufficient documentation

## 2013-09-03 DIAGNOSIS — N938 Other specified abnormal uterine and vaginal bleeding: Secondary | ICD-10-CM

## 2013-09-03 DIAGNOSIS — N949 Unspecified condition associated with female genital organs and menstrual cycle: Secondary | ICD-10-CM | POA: Insufficient documentation

## 2013-09-03 DIAGNOSIS — D649 Anemia, unspecified: Secondary | ICD-10-CM

## 2013-09-03 DIAGNOSIS — R42 Dizziness and giddiness: Secondary | ICD-10-CM

## 2013-09-03 LAB — BASIC METABOLIC PANEL
BUN: 12 mg/dL (ref 6–23)
Chloride: 103 mEq/L (ref 96–112)
GFR calc Af Amer: >90 mL/min (ref >90)
GFR calc non Af Amer: 88 mL/min — ABNORMAL LOW (ref >90)
Potassium: 4.2 mEq/L (ref 3.5–5.1)
Sodium: 138 mEq/L (ref 135–145)

## 2013-09-03 LAB — CBC
HCT: 26.5 % — ABNORMAL LOW (ref 36.0–46.0)
MCHC: 30.9 g/dL (ref 30.0–36.0)
Platelets: 382 10*3/uL (ref 150–400)
RDW: 17.1 % — ABNORMAL HIGH (ref 11.5–15.5)
WBC: 6.3 10*3/uL (ref 4.0–10.5)

## 2013-09-03 MED ORDER — FERROUS SULFATE 325 (65 FE) MG PO TABS
325.0000 mg | ORAL_TABLET | Freq: Two times a day (BID) | ORAL | Status: DC
  Administered 2013-09-04: 325 mg via ORAL
  Filled 2013-09-03 (×2): qty 1

## 2013-09-03 NOTE — ED Notes (Addendum)
Pt reports she has constantly felt dizzy today since awakening, reports nothing increases or decreases the dizziness, reports that she was told she had anemia in the past and believes this could be related. Pt reports she has been on her menstrual cycle x3 weeks at this time

## 2013-09-03 NOTE — ED Notes (Signed)
Unsuccessfully attempted to obtain blood for labs.RN made aware 

## 2013-09-03 NOTE — ED Notes (Signed)
Phlebotomy at bedside to collect labs.

## 2013-09-03 NOTE — ED Provider Notes (Signed)
CSN: [id number]     Arrival date & time 09/03/13  2126 History   First MD Initiated Contact with Patient 09/03/13 2247     Chief Complaint  Patient presents with  . Dizziness   (Consider location/radiation/quality/duration/timing/severity/associated sxs/prior Treatment) HPI Comments: Patient with history of anemia presents with a day history of dizziness.  She reports this started about mid morning, she noticed that this was more prevalent with exertion than when she would rest - she describes more lightheadedness rather than the sense of motion.  She reports worsens when initially stands as well.  She states that she started her menstrual cycle about 3 weeks ago, initially it was light, then has progressed to heavy now, reporting changing 1 pad per hour.  She has noticed clots but denies abdominal pain, nausea, or vomiting.  She reports no change in head movement.  Also denies headache, blurred vision, neck pain, fever, chills, chest pain, shortness of breath, vaginal discharge, dysuria.  Patient is a 32 y.o. female presenting with neurologic complaint. The history is provided by the patient. No language interpreter was used.  Neurologic Problem This is a new problem. The current episode started today. The problem occurs constantly. The problem has been unchanged. Associated symptoms include fatigue and weakness. Pertinent negatives include no abdominal pain, arthralgias, chest pain, congestion, coughing, diaphoresis, fever, headaches, myalgias, nausea, numbness, rash, urinary symptoms, vertigo or vomiting. The symptoms are aggravated by exertion. She has tried rest for the symptoms. The treatment provided no relief.    Past Medical History  Diagnosis Date  . Hypertension    Past Surgical History  Procedure Laterality Date  . Hip surgery Bilateral    History reviewed. No pertinent family history. History  Substance Use Topics  . Smoking status: Never Smoker   . Smokeless tobacco: Not on  file  . Alcohol Use: No   OB History   Grav Para Term Preterm Abortions TAB SAB Ect Mult Living                 Review of Systems  Constitutional: Positive for fatigue. Negative for fever and diaphoresis.  HENT: Negative for congestion.   Respiratory: Negative for cough.   Cardiovascular: Negative for chest pain.  Gastrointestinal: Negative for nausea, vomiting and abdominal pain.  Musculoskeletal: Negative for arthralgias and myalgias.  Skin: Negative for rash.  Neurological: Positive for weakness and light-headedness. Negative for vertigo, syncope, numbness and headaches.  All other systems reviewed and are negative.    Allergies  Review of patient's allergies indicates no known allergies.  Home Medications  No current outpatient prescriptions on file. BP 161/93  Pulse 110  Temp(Src) 99.4 F (37.4 C) (Oral)  Resp 20  SpO2 99%  LMP 08/28/2013 Physical Exam  Nursing note and vitals reviewed. Constitutional: She is oriented to person, place, and time. She appears well-developed and well-nourished. No distress.  HENT:  Head: Normocephalic and atraumatic.  Right Ear: External ear normal.  Left Ear: External ear normal.  Nose: Nose normal.  Mouth/Throat: Oropharynx is clear and moist. No oropharyngeal exudate.  Eyes: EOM are normal. Pupils are equal, round, and reactive to light. No scleral icterus.  Mild pale conjunctiva  Neck: Normal range of motion. Neck supple.  Cardiovascular: Regular rhythm and normal heart sounds.  Exam reveals no gallop and no friction rub.   No murmur heard. tachycardia  Pulmonary/Chest: Effort normal and breath sounds normal. No respiratory distress. She has no wheezes. She has no rales. She exhibits no tenderness.  Abdominal: Soft. Bowel sounds are normal. She exhibits no distension and no mass. There is no tenderness. There is no rebound and no guarding.  Musculoskeletal: Normal range of motion. She exhibits no edema and no tenderness.   Lymphadenopathy:    She has no cervical adenopathy.  Neurological: She is alert and oriented to person, place, and time. She exhibits normal muscle tone. Coordination normal.  Skin: Skin is warm and dry. No rash noted. No erythema. No pallor.  Psychiatric: She has a normal mood and affect. Her behavior is normal. Judgment and thought content normal.    ED Course  Procedures (including critical care time) Labs Review Labs Reviewed  CBC  BASIC METABOLIC PANEL   Imaging Review No results found.  EKG Interpretation   None      Results for orders placed during the hospital encounter of 09/03/13  CBC      Result Value Range   WBC 6.3  4.0 - 10.5 K/uL   RBC 3.75 (*) 3.87 - 5.11 MIL/uL   Hemoglobin 8.2 (*) 12.0 - 15.0 g/dL   HCT 45.4 (*) 09.8 - 11.9 %   MCV 70.7 (*) 78.0 - 100.0 fL   MCH 21.9 (*) 26.0 - 34.0 pg   MCHC 30.9  30.0 - 36.0 g/dL   RDW 14.7 (*) 82.9 - 56.2 %   Platelets 382  150 - 400 K/uL  BASIC METABOLIC PANEL      Result Value Range   Sodium 138  135 - 145 mEq/L   Potassium 4.2  3.5 - 5.1 mEq/L   Chloride 103  96 - 112 mEq/L   CO2 28  19 - 32 mEq/L   Glucose, Bld 103 (*) 70 - 99 mg/dL   BUN 12  6 - 23 mg/dL   Creatinine, Ser 1.30  0.50 - 1.10 mg/dL   Calcium 9.2  8.4 - 86.5 mg/dL   GFR calc non Af Amer 88 (*) >90 mL/min   GFR calc Af Amer >90  >90 mL/min   No results found.    MDM  Lightheadedness DUB Anemia  Patient with history of anemia and DUB presents with heavier than normal bleeding and feeling lightheaded.  She was initially tachycardic but her pulse has normalized once here.  She denies syncope, shortness of breath.  Plan will be to start on iron supplementation and BCP's to stop the bleeding.  She has a GYN and will follow up with them.   Izola Price Marisue Humble, PA-C 09/04/13 0009

## 2013-09-04 MED ORDER — FERROUS SULFATE 325 (65 FE) MG PO TABS: 325.0000 mg | ORAL_TABLET | Freq: Two times a day (BID) | ORAL | Status: AC

## 2013-09-04 MED ORDER — NORGESTIM-ETH ESTRAD TRIPHASIC 0.18/0.215/0.25 MG-35 MCG PO TABS: 1.0000 | ORAL_TABLET | Freq: Every day | ORAL | Status: DC

## 2013-09-04 NOTE — ED Provider Notes (Signed)
Medical screening examination/treatment/procedure(s) were performed by non-physician practitioner and as supervising physician I was immediately available for consultation/collaboration.  EKG Interpretation   None         Tery Hoeger E Bruchy Mikel, MD 09/04/13 1251 

## 2014-05-14 ENCOUNTER — Ambulatory Visit: Payer: Self-pay

## 2014-05-22 ENCOUNTER — Encounter: Payer: Self-pay | Admitting: *Deleted

## 2014-06-21 ENCOUNTER — Encounter: Payer: Self-pay | Admitting: Obstetrics & Gynecology

## 2014-08-30 ENCOUNTER — Telehealth: Payer: Self-pay

## 2014-08-30 NOTE — Telephone Encounter (Signed)
GAVE PATIENT APPT TIME AND DATE OF APPT - ALPHA REFERRAL- 09/03/14 AT 4:15PM WITH DR. HARPER

## 2014-09-03 ENCOUNTER — Ambulatory Visit: Payer: Medicaid Other | Admitting: Obstetrics

## 2014-09-06 ENCOUNTER — Encounter: Payer: Self-pay | Admitting: *Deleted

## 2014-09-06 ENCOUNTER — Encounter: Payer: Medicaid Other | Attending: Internal Medicine | Admitting: *Deleted

## 2014-09-06 DIAGNOSIS — Z713 Dietary counseling and surveillance: Secondary | ICD-10-CM | POA: Insufficient documentation

## 2014-09-06 DIAGNOSIS — Z6841 Body Mass Index (BMI) 40.0 and over, adult: Secondary | ICD-10-CM | POA: Insufficient documentation

## 2014-09-06 NOTE — Progress Notes (Signed)
Medical Nutrition Therapy:  Appt start time: 1100 end time:  1200.  Assessment:  Patient here today for weight management. Patient with a long history of obesity. She reports a desire to lose weight. She has successfully lost about 35 pounds maintaining a 2000 kcal diet and walking 30 minutes daily. However, she has arthritis, which prevents her from walking long distances currently. She is home most of the day, and is fairly sedentary. She eats fast food daily 1-2 times daily and drinks 1-2 20 ounce sodas or sweet tea daily. She also snacks on sweets.   MEDICATIONS: See list   DIETARY INTAKE:   Usual eating pattern includes 2-3 meals and 2 snacks per day.  24-hr recall:  B ( AM): Sometimes skip: Fruit smoothies OR 2 eggs, 4 slices bacon, water  Snk ( AM): None  L ( PM): [employer]: burger/chicken nuggets, fries, regular soda Snk ( PM): Candy bar, ice cream 1.5 cups D ( PM): 4 chicken wings (baked), rice (2-2.5 cups), corn (1/2 cup), water/soda/sweet tea OR fast food: spaghetti, pizza Snk ( PM): Same as afternoon Beverages: water, soda, sweet tea  Usual physical activity: Limited activity  Estimated energy needs: 1500 calories  Progress Towards Goal(s):  In progress.   Nutritional Diagnosis:  Jermyn-3.3 Overweight/obesity As related to excessive energy intake.  As evidenced by BMI >30.    Intervention:  Nutrition counseling. We discussed strategies for weight loss, including balancing nutrients (carbs, protein, fat), portion control, healthy snacks, and exercise. We discussed realistic weight loss expectations and ways to incorporate more physical activity into her daily life.   Goals:  1. 1 pound weight loss per week. Goal: 8 pounds weight loss per month.  2. Eat 3 balanced meals daily. Keep pantry/freezer staples available in the home for quick and easy meal prep. Prepare extra portions and save for later meals.  3. Monitor portion size.  4. Choose healthier options eating out (smaller  hamburger, grilled chicken, salad, healthy sides).  5. Decrease intake of sweetened drinks. Limit to no more than 12 ounces on occasion.  6. Choose healthy snacks. 7. Increase physical activity in daily life by getting up and moving for at least a few minutes every hour (housework, light walking)  Handouts given during visit include:  Weight loss tips  Meal plan card  Monitoring/Evaluation:  Dietary intake, exercise, and body weight prn.

## 2014-09-11 ENCOUNTER — Ambulatory Visit: Payer: Self-pay | Admitting: Obstetrics & Gynecology

## 2014-11-04 ENCOUNTER — Encounter: Payer: Self-pay | Admitting: *Deleted

## 2014-11-05 ENCOUNTER — Encounter: Payer: Self-pay | Admitting: Obstetrics & Gynecology

## 2015-05-23 ENCOUNTER — Ambulatory Visit: Payer: Medicaid Other | Admitting: Certified Nurse Midwife

## 2015-06-13 ENCOUNTER — Ambulatory Visit (INDEPENDENT_AMBULATORY_CARE_PROVIDER_SITE_OTHER): Payer: Medicaid Other | Admitting: Certified Nurse Midwife

## 2015-06-13 ENCOUNTER — Encounter: Payer: Self-pay | Admitting: Certified Nurse Midwife

## 2015-06-13 VITALS — Temp 98.3°F | Ht 66.0 in

## 2015-06-13 DIAGNOSIS — Z01419 Encounter for gynecological examination (general) (routine) without abnormal findings: Secondary | ICD-10-CM

## 2015-06-13 DIAGNOSIS — Z0000 Encounter for general adult medical examination without abnormal findings: Secondary | ICD-10-CM | POA: Diagnosis not present

## 2015-06-13 DIAGNOSIS — Z113 Encounter for screening for infections with a predominantly sexual mode of transmission: Secondary | ICD-10-CM | POA: Diagnosis not present

## 2015-06-13 DIAGNOSIS — N926 Irregular menstruation, unspecified: Secondary | ICD-10-CM

## 2015-06-13 LAB — COMPREHENSIVE METABOLIC PANEL
ALBUMIN: 3.8 g/dL (ref 3.6–5.1)
ALT: 16 U/L (ref 6–29)
AST: 19 U/L (ref 10–30)
Alkaline Phosphatase: 67 U/L (ref 33–115)
BUN: 11 mg/dL (ref 7–25)
CALCIUM: 9 mg/dL (ref 8.6–10.2)
CO2: 23 mmol/L (ref 20–31)
Chloride: 104 mmol/L (ref 98–110)
Creat: 0.64 mg/dL (ref 0.50–1.10)
Glucose, Bld: 85 mg/dL (ref 65–99)
Potassium: 4.5 mmol/L (ref 3.5–5.3)
Sodium: 139 mmol/L (ref 135–146)
Total Bilirubin: 0.4 mg/dL (ref 0.2–1.2)
Total Protein: 7.6 g/dL (ref 6.1–8.1)

## 2015-06-13 LAB — HEMOGLOBIN A1C
Hgb A1c MFr Bld: 5.9 % — ABNORMAL HIGH (ref <5.7)
MEAN PLASMA GLUCOSE: 123 mg/dL — AB (ref <117)

## 2015-06-13 LAB — TRIGLYCERIDES: Triglycerides: 67 mg/dL (ref <150)

## 2015-06-13 LAB — CHOLESTEROL, TOTAL: CHOLESTEROL: 160 mg/dL (ref 125–200)

## 2015-06-13 LAB — HDL CHOLESTEROL: HDL: 58 mg/dL (ref >=46)

## 2015-06-13 NOTE — Progress Notes (Signed)
Patient ID: Monique Hunter, female   DOB: [birth year], 34 y.o.   MRN: [id number]    Subjective:      Monique Hunter is a 34 y.o. female here for a routine exam.  Current complaints: irregular periods, vaginal odor, vaginal pain with bleeding after sexual intercourse.  Currently unemployed.  Not currently sexually active.  Chronic medical conditions: HTN, morbid obesity.    Personal health questionnaire:  Is patient Ashkenazi Jewish, have a family history of breast and/or ovarian cancer: no Is there a family history of uterine cancer diagnosed at age < 5, gastrointestinal cancer, urinary tract cancer, family member who is a Personnel officer syndrome-associated carrier: no Is the patient overweight and hypertensive, family history of diabetes, personal history of gestational diabetes, preeclampsia or PCOS: yes Is patient over 74, have PCOS,  family history of premature CHD under age 25, diabetes, smoke, have hypertension or peripheral artery disease:  yes At any time, has a partner hit, kicked or otherwise hurt or frightened you?: no Over the past 2 weeks, have you felt down, depressed or hopeless?: yes Over the past 2 weeks, have you felt little interest or pleasure in doing things?:sometimes   Gynecologic History Patient's last menstrual period was 03/23/2015 (approximate). Contraception: abstinence Last Pap: 10/2013. Results were: normal Last mammogram: N/A.   Obstetric History OB History  Gravida Para Term Preterm AB SAB TAB Ectopic Multiple Living  # Outcome Date GA Lbr Len/2nd Weight Sex Delivery Anes PTL Lv  1 Term 08/26/10 [redacted]w[redacted]d  5 lb 11 oz (2.58 kg) F CS-LTranv Spinal N Y      Past Medical History  Diagnosis Date  . Hypertension   . Anemia     Past Surgical History  Procedure Laterality Date  . Hip surgery Bilateral   . Cesarean section       Current outpatient prescriptions:  .  ferrous sulfate 325 (65 FE) MG tablet, Take 1 tablet (325 mg total) by mouth 2 (two)  times daily with a meal., Disp: 60 tablet, Rfl: 3 No Known Allergies  History  Substance Use Topics  . Smoking status: Never Smoker   . Smokeless tobacco: Not on file  . Alcohol Use: No    Family History  Problem Relation Age of Onset  . Hypertension Mother   . Congestive Heart Failure Mother   . Hypertension Father   . Congestive Heart Failure Father       Review of Systems  Constitutional: negative for fatigue and weight loss Respiratory: negative for cough and wheezing Cardiovascular: negative for chest pain, fatigue and palpitations Gastrointestinal: negative for abdominal pain and change in bowel habits Musculoskeletal:negative for myalgias Neurological: negative for gait problems and tremors Behavioral/Psych: negative for abusive relationship, depression Endocrine: negative for temperature intolerance, + obesity   Genitourinary:negative for abnormal menstrual periods, genital lesions, hot flashes, sexual problems and vaginal discharge, + irregular periods Integument/breast: negative for breast lump, breast tenderness, nipple discharge and skin lesion(s)    Objective:       Temp(Src) 98.3 F (36.8 C)  Ht  (1.676 m)  LMP 03/23/2015 (Approximate) General:   alert  Skin:   no rash or abnormalities  Lungs:   clear to auscultation bilaterally  Heart:   regular rate and rhythm, S1, S2 normal, no murmur, click, rub or gallop  Breasts:   normal without suspicious masses, skin or nipple changes or axillary nodes  Abdomen:  normal findings:  no organomegaly, soft, non-tender and no hernia  Pelvis:  External genitalia: normal general appearance Urinary system: urethral meatus normal and bladder without fullness, nontender Vaginal: normal without tenderness, induration or masses *difficult to assess d/t body habitus Cervix: normal appearance Adnexa: normal bimanual exam Uterus: anteverted and non-tender, normal size   Lab Review Urine pregnancy test Labs reviewed  yes Radiologic studies reviewed no  50% of 30 min visit spent on counseling and coordination of care.   Assessment:    Healthy female exam.   Morbid Obesity Contraception counseling Irregular periods most likely d/t obesity   Plan:    Education reviewed: calcium supplements, depression evaluation, low fat, low cholesterol diet, safe sex/STD prevention, self breast exams, skin cancer screening and weight bearing exercise. Contraception: condoms. Follow up in: 1 year.   No orders of the defined types were placed in this encounter.   Orders Placed This Encounter  Procedures  . SureSwab, Vaginosis/Vaginitis Plus  . HIV antibody (with reflex)  . Hepatitis B surface antigen  . RPR  . Hepatitis C antibody  . CBC with Differential/Platelet  . Comprehensive metabolic panel  . TSH  . Cholesterol, total  . Triglycerides  . HDL cholesterol  . Prolactin  . Testosterone, Free, Total, SHBG  . 17-Hydroxyprogesterone  . Progesterone  . Estrogens, Total  . Hemoglobin A1c

## 2015-06-14 LAB — CBC WITH DIFFERENTIAL/PLATELET
Basophils Absolute: 0 10*3/uL (ref 0.0–0.1)
Basophils Relative: 0 % (ref 0–1)
EOS ABS: 0.3 10*3/uL (ref 0.0–0.7)
EOS PCT: 6 % — AB (ref 0–5)
HEMATOCRIT: 29.2 % — AB (ref 36.0–46.0)
Hemoglobin: 8.5 g/dL — ABNORMAL LOW (ref 12.0–15.0)
LYMPHS ABS: 1.6 10*3/uL (ref 0.7–4.0)
LYMPHS PCT: 30 % (ref 12–46)
MCH: 17.7 pg — AB (ref 26.0–34.0)
MCHC: 29.1 g/dL — ABNORMAL LOW (ref 30.0–36.0)
MCV: 61 fL — ABNORMAL LOW (ref 78.0–100.0)
MONO ABS: 0.4 10*3/uL (ref 0.1–1.0)
MPV: 8.3 fL — ABNORMAL LOW (ref 8.6–12.4)
Monocytes Relative: 7 % (ref 3–12)
Neutro Abs: 3 10*3/uL (ref 1.7–7.7)
Neutrophils Relative %: 57 % (ref 43–77)
Platelets: 488 10*3/uL — ABNORMAL HIGH (ref 150–400)
RBC: 4.79 MIL/uL (ref 3.87–5.11)
RDW: 23.2 % — AB (ref 11.5–15.5)
WBC: 5.3 10*3/uL (ref 4.0–10.5)

## 2015-06-14 LAB — HEPATITIS B SURFACE ANTIGEN: HEP B S AG: NEGATIVE

## 2015-06-14 LAB — HIV ANTIBODY (ROUTINE TESTING W REFLEX): HIV 1&2 Ab, 4th Generation: NONREACTIVE

## 2015-06-14 LAB — TSH: TSH: 3.343 u[IU]/mL (ref 0.350–4.500)

## 2015-06-14 LAB — PROGESTERONE: PROGESTERONE: 0.5 ng/mL

## 2015-06-14 LAB — PROLACTIN: PROLACTIN: 15.9 ng/mL

## 2015-06-14 LAB — HEPATITIS C ANTIBODY: HCV Ab: NEGATIVE

## 2015-06-14 LAB — RPR

## 2015-06-16 LAB — TESTOSTERONE, FREE, TOTAL, SHBG
Sex Hormone Binding: 31 nmol/L (ref 17–124)
TESTOSTERONE FREE: 7.1 pg/mL — AB (ref 0.6–6.8)
Testosterone-% Free: 1.9 % (ref 0.4–2.4)
Testosterone: 38 ng/dL (ref 10–70)

## 2015-06-17 ENCOUNTER — Other Ambulatory Visit: Payer: Self-pay | Admitting: Certified Nurse Midwife

## 2015-06-17 DIAGNOSIS — D500 Iron deficiency anemia secondary to blood loss (chronic): Secondary | ICD-10-CM

## 2015-06-17 LAB — ESTROGENS, TOTAL: Estrogen: 253 pg/mL

## 2015-06-17 LAB — PAP IG AND HPV HIGH-RISK: HPV DNA High Risk: NOT DETECTED

## 2015-06-17 LAB — 17-HYDROXYPROGESTERONE: 17-OH-PROGESTERONE, LC/MS/MS: 35 ng/dL

## 2015-06-17 MED ORDER — FERIVA 21/7 75-1 MG PO TABS: 1.0000 | ORAL_TABLET | Freq: Every day | ORAL | Status: DC

## 2015-06-17 MED ORDER — CITRANATAL 90 DHA 90-1 & 300 MG PO MISC: 1.0000 | Freq: Every day | ORAL | Status: DC

## 2015-06-18 LAB — SURESWAB, VAGINOSIS/VAGINITIS PLUS
Atopobium vaginae: NOT DETECTED Log (cells/mL)
C. GLABRATA, DNA: NOT DETECTED
C. PARAPSILOSIS, DNA: NOT DETECTED
C. albicans, DNA: NOT DETECTED
C. trachomatis RNA, TMA: NOT DETECTED
C. tropicalis, DNA: NOT DETECTED
Gardnerella vaginalis: NOT DETECTED Log (cells/mL)
LACTOBACILLUS SPECIES: NOT DETECTED Log (cells/mL)
MEGASPHAERA SPECIES: NOT DETECTED Log (cells/mL)
N. GONORRHOEAE RNA, TMA: NOT DETECTED
T. vaginalis RNA, QL TMA: NOT DETECTED

## 2015-09-16 ENCOUNTER — Ambulatory Visit (INDEPENDENT_AMBULATORY_CARE_PROVIDER_SITE_OTHER): Payer: Medicaid Other | Admitting: Obstetrics

## 2015-09-16 VITALS — BP 150/93 | HR 91 | Temp 97.4°F | Wt >= 6400 oz

## 2015-09-16 DIAGNOSIS — R300 Dysuria: Secondary | ICD-10-CM

## 2015-09-16 LAB — POCT URINALYSIS DIPSTICK
Bilirubin, UA: NEGATIVE
GLUCOSE UA: NEGATIVE
KETONES UA: NEGATIVE
Leukocytes, UA: NEGATIVE
NITRITE UA: NEGATIVE
Protein, UA: NEGATIVE
RBC UA: NEGATIVE
Spec Grav, UA: 1.01
Urobilinogen, UA: NEGATIVE
pH, UA: 5

## 2015-09-16 MED ORDER — CEFUROXIME AXETIL 500 MG PO TABS: 500.0000 mg | ORAL_TABLET | Freq: Two times a day (BID) | ORAL | Status: DC

## 2015-09-16 NOTE — Progress Notes (Signed)
Patient ID: Monique Hunter, female   DOB: [birth year], 34 y.o.   MRN: [id number]  Chief Complaint  Patient presents with  . Gynecologic Exam    patient thinks she has a UTI- frequency and lower abdominal pain.     HPI Monique Hunter is a 34 y.o. female.  Urinary frequency and lower abdominal pain.  HPI  Past Medical History  Diagnosis Date  . Hypertension   . Anemia     Past Surgical History  Procedure Laterality Date  . Hip surgery Bilateral   . Cesarean section      Family History  Problem Relation Age of Onset  . Hypertension Mother   . Congestive Heart Failure Mother   . Hypertension Father   . Congestive Heart Failure Father     Social History Social History  Substance Use Topics  . Smoking status: Never Smoker   . Smokeless tobacco: Not on file  . Alcohol Use: No    No Known Allergies  Current Outpatient Prescriptions  Medication Sig Dispense Refill  . ferrous sulfate 325 (65 FE) MG tablet Take 1 tablet (325 mg total) by mouth 2 (two) times daily with a meal. 60 tablet 3  . cefUROXime (CEFTIN) 500 MG tablet Take 1 tablet (500 mg total) by mouth 2 (two) times daily with a meal. 14 tablet 1   No current facility-administered medications for this visit.    Review of Systems Review of Systems Constitutional: negative for fatigue and weight loss Respiratory: negative for cough and wheezing Cardiovascular: negative for chest pain, fatigue and palpitations Gastrointestinal: positive for abdominal pain and negative for change in bowel habits Genitourinary: positive for frequency and lower abdominal pain Integument/breast: negative for nipple discharge Musculoskeletal:negative for myalgias Neurological: negative for gait problems and tremors Behavioral/Psych: negative for abusive relationship, depression Endocrine: negative for temperature intolerance     Blood pressure 150/93, pulse 91, temperature 97.4 F (36.3 C), weight 435 lb (197.315 kg), last menstrual  period 08/24/2015.  Physical Exam Physical Exam: Deferred   100% of 10 min visit spent on counseling and coordination of care.   Data Reviewed Urine Dip  Assessment     Dysuria.  Urine Dip negative but clinically patient is symptomatic.    Plan   Ceftin Rx  Urine culture ordered  Orders Placed This Encounter  Procedures  . Urine Culture  . POCT urinalysis dipstick   Meds ordered this encounter  Medications  . cefUROXime (CEFTIN) 500 MG tablet    Sig: Take 1 tablet (500 mg total) by mouth 2 (two) times daily with a meal.    Dispense:  14 tablet    Refill:  1

## 2015-09-18 LAB — URINE CULTURE
Colony Count: NO GROWTH
Organism ID, Bacteria: NO GROWTH

## 2016-03-17 ENCOUNTER — Encounter (HOSPITAL_COMMUNITY): Payer: Self-pay | Admitting: *Deleted

## 2016-03-17 ENCOUNTER — Emergency Department (HOSPITAL_COMMUNITY)
Admission: EM | Admit: 2016-03-17 | Discharge: 2016-03-18 | Disposition: A | Discharge status: Home / Self Care | Payer: Medicaid Other | Attending: Emergency Medicine | Admitting: Emergency Medicine

## 2016-03-17 ENCOUNTER — Emergency Department (HOSPITAL_COMMUNITY): Payer: Medicaid Other

## 2016-03-17 DIAGNOSIS — R5383 Other fatigue: Secondary | ICD-10-CM | POA: Insufficient documentation

## 2016-03-17 DIAGNOSIS — I10 Essential (primary) hypertension: Secondary | ICD-10-CM | POA: Diagnosis not present

## 2016-03-17 DIAGNOSIS — Z79899 Other long term (current) drug therapy: Secondary | ICD-10-CM | POA: Diagnosis not present

## 2016-03-17 DIAGNOSIS — R509 Fever, unspecified: Secondary | ICD-10-CM | POA: Insufficient documentation

## 2016-03-17 DIAGNOSIS — R000 Tachycardia, unspecified: Secondary | ICD-10-CM | POA: Insufficient documentation

## 2016-03-17 DIAGNOSIS — J029 Acute pharyngitis, unspecified: Secondary | ICD-10-CM | POA: Insufficient documentation

## 2016-03-17 DIAGNOSIS — R6889 Other general symptoms and signs: Secondary | ICD-10-CM

## 2016-03-17 DIAGNOSIS — D649 Anemia, unspecified: Secondary | ICD-10-CM | POA: Insufficient documentation

## 2016-03-17 DIAGNOSIS — R05 Cough: Secondary | ICD-10-CM | POA: Diagnosis present

## 2016-03-17 DIAGNOSIS — M791 Myalgia: Secondary | ICD-10-CM | POA: Insufficient documentation

## 2016-03-17 DIAGNOSIS — R0602 Shortness of breath: Secondary | ICD-10-CM | POA: Diagnosis not present

## 2016-03-17 MED ORDER — ACETAMINOPHEN 325 MG PO TABS
650.0000 mg | ORAL_TABLET | Freq: Once | ORAL | Status: AC | PRN
  Administered 2016-03-17: 650 mg via ORAL
  Filled 2016-03-17: qty 2

## 2016-03-17 NOTE — ED Notes (Signed)
Pt reports cough, sob, sore throat and chills x 2 weeks. No meds taken today for fever. Pt reports she has also been out of her bp meds for several weeks, has appt with her doctor next week to fill.

## 2016-03-17 NOTE — ED Notes (Signed)
Patient transported to X-ray 

## 2016-03-17 NOTE — ED Notes (Signed)
Pt denies N/V/D

## 2016-03-17 NOTE — ED Notes (Signed)
MD at bedside. 

## 2016-03-17 NOTE — ED Provider Notes (Signed)
CSN: [id number]     Arrival date & time 03/17/16  2257 History  By signing my name below, I, Marisue HumbleMichelle Chaffee, attest that this documentation has been prepared under the direction and in the presence of Shon Batonourtney F Horton, MD . Electronically Signed: Marisue HumbleMichelle Chaffee, Scribe. 03/17/2016. 11:34 PM.   Chief Complaint  Patient presents with  . Cough   The history is provided by the patient. No language interpreter was used.   HPI Comments:  Monique Hunter is a 35 y.o. female with PMHx of HTN who presents to the Emergency Department complaining of persistent, moderate sore throat onset 2 weeks ago. Pt reports associated non-productive cough, generalized myalgias, fatigue, shortness of breath, and painful swallowing. She has used cough drops with mild relief. No exacerbating factors noted. Her daughter has had strep throat within last 2 weeks. Pt did get a flu shot this season. Denies fever prior to today.  Past Medical History  Diagnosis Date  . Hypertension   . Anemia    Past Surgical History  Procedure Laterality Date  . Hip surgery Bilateral   . Cesarean section     Family History  Problem Relation Age of Onset  . Hypertension Mother   . Congestive Heart Failure Mother   . Hypertension Father   . Congestive Heart Failure Father    Social History  Substance Use Topics  . Smoking status: Never Smoker   . Smokeless tobacco: None  . Alcohol Use: No   OB History    Gravida Para Term Preterm AB TAB SAB Ectopic Multiple Living   1 1 1       1      Review of Systems  Constitutional: Positive for fever and fatigue.  HENT: Positive for sore throat.   Respiratory: Positive for cough and shortness of breath.   Cardiovascular: Negative for chest pain.  Gastrointestinal: Negative for nausea, vomiting and abdominal pain.  Musculoskeletal: Positive for myalgias (generalized).  All other systems reviewed and are negative.  Allergies  Review of patient's allergies indicates no known  allergies.  Home Medications   Prior to Admission medications   Medication Sig Start Date End Date Taking? Authorizing Provider  ferrous sulfate 325 (65 FE) MG tablet Take 1 tablet (325 mg total) by mouth 2 (two) times daily with a meal. 09/04/13  Yes Cherrie DistanceFrances Sanford, PA-C  amoxicillin (AMOXIL) 500 MG capsule Take 1 capsule (500 mg total) by mouth 2 (two) times daily. 03/18/16   Shon Batonourtney F Horton, MD  cefUROXime (CEFTIN) 500 MG tablet Take 1 tablet (500 mg total) by mouth 2 (two) times daily with a meal. Patient not taking: Reported on 03/17/2016 09/16/15   Brock Badharles A Harper, MD  ibuprofen (ADVIL,MOTRIN) 600 MG tablet Take 1 tablet (600 mg total) by mouth every 6 (six) hours as needed. 03/18/16   Shon Batonourtney F Horton, MD   BP 162/90 mmHg  Pulse 109  Temp(Src) 100 F (37.8 C) (Oral)  Resp 21  Ht 5\' 6"  (1.676 m)  Wt 450 lb (204.119 kg)  BMI 72.67 kg/m2  SpO2 95%  LMP 02/21/2016 Physical Exam  Constitutional: She is oriented to person, place, and time. She appears well-developed and well-nourished.  Morbidly obese  HENT:  Head: Normocephalic and atraumatic.  Mild erythema posterior oropharynx, uvula midline, symmetric tonsillar enlargement, no exudate  Eyes: Pupils are equal, round, and reactive to light.  Cardiovascular: Regular rhythm and normal heart sounds.   No murmur heard. Tachycardia  Pulmonary/Chest: Effort normal and breath sounds normal. No  respiratory distress. She has no wheezes.  Abdominal: Soft. Bowel sounds are normal. There is no tenderness. There is no rebound.  Lymphadenopathy:    She has cervical adenopathy.  Neurological: She is alert and oriented to person, place, and time.  Skin: Skin is warm and dry.  Psychiatric: She has a normal mood and affect.  Nursing note and vitals reviewed.   ED Course  Procedures  DIAGNOSTIC STUDIES:  Oxygen Saturation is 94% on RA, adequate by my interpretation.    COORDINATION OF CARE:  11:25 PM Will swab for strep and order  chest x-ray. Discussed treatment plan with pt at bedside and pt agreed to plan.  Labs Review Labs Reviewed  RAPID STREP SCREEN (NOT AT [facility])  CULTURE, GROUP A STREP [facility])    Imaging Review Dg Chest 2 View  03/17/2016  CLINICAL DATA:  Cold, congestion for 2 weeks, fever tonight. History of hypertension. EXAM: CHEST  2 VIEW COMPARISON:  None. FINDINGS: Large body habitus limits evaluation. Cardiomediastinal silhouette is normal. The lungs are clear without pleural effusions or focal consolidations. Trachea projects midline and there is no pneumothorax. Soft tissue planes and included osseous structures are non-suspicious. IMPRESSION: No active cardiopulmonary disease on this habitus limited examination. Electronically Signed   By: Awilda Metro M.D.   On: 03/17/2016 23:44   I have personally reviewed and evaluated these images and lab results as part of my medical decision-making.   EKG Interpretation   Date/Time:  Wednesday Mar 17 2016 23:54:56 EDT Ventricular Rate:  117 PR Interval:  157 QRS Duration: 82 QT Interval:  294 QTC Calculation: 410 R Axis:   79 Text Interpretation:  Sinus tachycardia Low voltage, precordial leads  Borderline T abnormalities, diffuse leads Confirmed by HORTON  MD,  COURTNEY ([id number]) on 03/18/2016 12:14:44 AM      MDM   Final diagnoses:  Flu-like symptoms  Essential hypertension    Patient presents with cough and sore throat. Found to have a fever in triage. Symptoms have been ongoing for the last 2 weeks. Vital signs notable for a temperature of 101.3 and a heart rate of 130. Patient was given Tylenol. She is otherwise nontoxic. She is 2 out of 4 Centor criteria.  Strep screen is negative and chest x-ray without evidence of pneumonia. Patient's tachycardia improved with temperature management. Repeat heart rate 109 with a temperature of 100. Suspect viral etiology versus the flu. She is out of the window for Tamiflu. Discussed with patient supportive  measures. Given known strep contact, will provide the patient with a prescription for amoxicillin. I have requested the patient hold the prescription and only fill if called with positive strep culture. Patient stated understanding.  After history, exam, and medical workup I feel the patient has been appropriately medically screened and is safe for discharge home. Pertinent diagnoses were discussed with the patient. Patient was given return precautions.  I personally performed the services described in this documentation, which was scribed in my presence. The recorded information has been reviewed and is accurate.    Shon Baton, MD 03/18/16 [phone]

## 2016-03-17 NOTE — ED Notes (Signed)
Pt returned from X Ray.

## 2016-03-18 LAB — RAPID STREP SCREEN (MED CTR MEBANE ONLY): STREPTOCOCCUS, GROUP A SCREEN (DIRECT): NEGATIVE

## 2016-03-18 MED ORDER — IBUPROFEN 600 MG PO TABS: 600.0000 mg | ORAL_TABLET | Freq: Four times a day (QID) | ORAL | Status: DC | PRN

## 2016-03-18 MED ORDER — AMOXICILLIN 500 MG PO CAPS: 500.0000 mg | ORAL_CAPSULE | Freq: Two times a day (BID) | ORAL | Status: DC

## 2016-03-18 MED ORDER — IBUPROFEN 200 MG PO TABS
400.0000 mg | ORAL_TABLET | Freq: Once | ORAL | Status: AC
  Administered 2016-03-18: 400 mg via ORAL
  Filled 2016-03-18: qty 2

## 2016-03-18 NOTE — Discharge Instructions (Signed)
You were seen today for fever, cough, sore throat. He likely have a virus. You need to take ibuprofen as needed for fevers. There is no evidence of pneumonia. Her strep screen was negative. However, given that her daughter had strep, you will be given a antibiotic for strep pharyngitis. Do not get this antibiotic filled. You will be advised if your strep culture comes back positive.  Viral Infections A viral infection can be caused by different types of viruses.Most viral infections are not serious and resolve on their own. However, some infections may cause severe symptoms and may lead to further complications. SYMPTOMS Viruses can frequently cause:  Minor sore throat.  Aches and pains.  Headaches.  Runny nose.  Different types of rashes.  Watery eyes.  Tiredness.  Cough.  Loss of appetite.  Gastrointestinal infections, resulting in nausea, vomiting, and diarrhea. These symptoms do not respond to antibiotics because the infection is not caused by bacteria. However, you might catch a bacterial infection following the viral infection. This is sometimes called a "superinfection." Symptoms of such a bacterial infection may include:  Worsening sore throat with pus and difficulty swallowing.  Swollen neck glands.  Chills and a high or persistent fever.  Severe headache.  Tenderness over the sinuses.  Persistent overall ill feeling (malaise), muscle aches, and tiredness (fatigue).  Persistent cough.  Yellow, green, or brown mucus production with coughing. HOME CARE INSTRUCTIONS   Only take over-the-counter or prescription medicines for pain, discomfort, diarrhea, or fever as directed by your caregiver.  Drink enough water and fluids to keep your urine clear or pale yellow. Sports drinks can provide valuable electrolytes, sugars, and hydration.  Get plenty of rest and maintain proper nutrition. Soups and broths with crackers or rice are fine. SEEK IMMEDIATE MEDICAL CARE  IF:   You have severe headaches, shortness of breath, chest pain, neck pain, or an unusual rash.  You have uncontrolled vomiting, diarrhea, or you are unable to keep down fluids.  You or your child has an oral temperature above 102 F (38.9 C), not controlled by medicine.  Your baby is older than 3 months with a rectal temperature of 102 F (38.9 C) or higher.  Your baby is 573 months old or younger with a rectal temperature of 100.4 F (38 C) or higher. MAKE SURE YOU:   Understand these instructions.  Will watch your condition.  Will get help right away if you are not doing well or get worse.   This information is not intended to replace advice given to you by your health care provider. Make sure you discuss any questions you have with your health care provider.   Document Released: 08/04/2005 Document Revised: 01/17/2012 Document Reviewed: 04/02/2015 Elsevier Interactive Patient Education [employer]2016 Elsevier Inc.

## 2016-03-20 LAB — CULTURE, GROUP A STREP (THRC)

## 2016-06-16 ENCOUNTER — Ambulatory Visit: Payer: Medicaid Other | Admitting: Certified Nurse Midwife

## 2017-03-22 ENCOUNTER — Ambulatory Visit: Payer: Medicaid Other | Admitting: Obstetrics

## 2017-04-06 ENCOUNTER — Ambulatory Visit (INDEPENDENT_AMBULATORY_CARE_PROVIDER_SITE_OTHER): Payer: Medicaid Other | Admitting: Obstetrics

## 2017-04-06 ENCOUNTER — Encounter: Payer: Self-pay | Admitting: Obstetrics

## 2017-04-06 ENCOUNTER — Other Ambulatory Visit (HOSPITAL_COMMUNITY)
Admission: RE | Admit: 2017-04-06 | Discharge: 2017-04-06 | Disposition: A | Discharge status: Home / Self Care | Payer: Medicaid Other | Source: Ambulatory Visit | Attending: Obstetrics | Admitting: Obstetrics

## 2017-04-06 VITALS — BP 164/89 | HR 111 | Ht 66.0 in | Wt >= 6400 oz

## 2017-04-06 DIAGNOSIS — Z01419 Encounter for gynecological examination (general) (routine) without abnormal findings: Secondary | ICD-10-CM

## 2017-04-06 DIAGNOSIS — Z0000 Encounter for general adult medical examination without abnormal findings: Secondary | ICD-10-CM | POA: Diagnosis not present

## 2017-04-06 DIAGNOSIS — N760 Acute vaginitis: Secondary | ICD-10-CM | POA: Insufficient documentation

## 2017-04-06 DIAGNOSIS — Z3009 Encounter for other general counseling and advice on contraception: Secondary | ICD-10-CM

## 2017-04-06 DIAGNOSIS — B9689 Other specified bacterial agents as the cause of diseases classified elsewhere: Secondary | ICD-10-CM | POA: Insufficient documentation

## 2017-04-06 DIAGNOSIS — E282 Polycystic ovarian syndrome: Secondary | ICD-10-CM

## 2017-04-06 NOTE — Progress Notes (Signed)
Subjective:        Monique Hunter is a 36 y.o. female here for a routine exam.  Current complaints: Irregular cycles.  Patient has had hip surgery with rods placed which make leg movements difficult on the table.  She has HTN and was told that she cannot have hormonal contraceptives until BP is controlled.    Personal health questionnaire:  Is patient Ashkenazi Jewish, have a family history of breast and/or ovarian cancer: no Is there a family history of uterine cancer diagnosed at age < 6, gastrointestinal cancer, urinary tract cancer, family member who is a Personnel officer syndrome-associated carrier: no Is the patient overweight and hypertensive, family history of diabetes, personal history of gestational diabetes, preeclampsia or PCOS: no Is patient over 24, have PCOS,  family history of premature CHD under age 36, diabetes, smoke, have hypertension or peripheral artery disease:  no At any time, has a partner hit, kicked or otherwise hurt or frightened you?: no Over the past 2 weeks, have you felt down, depressed or hopeless?: no Over the past 2 weeks, have you felt little interest or pleasure in doing things?:no   Gynecologic History Patient's last menstrual period was 02/12/2017. Contraception: none Last Pap: 2016. Results were: normal Last mammogram: n/a. Results were: n/a  Obstetric History OB History  Gravida Para Term Preterm AB Living  1 1 1     1   SAB TAB Ectopic Multiple Live Births          1    # Outcome Date GA Lbr Len/2nd Weight Sex Delivery Anes PTL Lv  1 Term 08/26/10 [redacted]w[redacted]d  5 lb 11 oz (2.58 kg) F CS-LTranv Spinal N LIV      Past Medical History:  Diagnosis Date  . Anemia   . Hypertension     Past Surgical History:  Procedure Laterality Date  . CESAREAN SECTION    . HIP SURGERY Bilateral      Current Outpatient Prescriptions:  .  ferrous sulfate 325 (65 FE) MG tablet, Take 1 tablet (325 mg total) by mouth 2 (two) times daily with a meal., Disp: 60 tablet,  Rfl: 3 .  hydrochlorothiazide (HYDRODIURIL) 25 MG tablet, Take 25 mg by mouth daily., Disp: , Rfl:  .  ibuprofen (ADVIL,MOTRIN) 600 MG tablet, Take 1 tablet (600 mg total) by mouth every 6 (six) hours as needed., Disp: 30 tablet, Rfl: 0 .  amoxicillin (AMOXIL) 500 MG capsule, Take 1 capsule (500 mg total) by mouth 2 (two) times daily., Disp: 20 capsule, Rfl: 0 .  cefUROXime (CEFTIN) 500 MG tablet, Take 1 tablet (500 mg total) by mouth 2 (two) times daily with a meal. (Patient not taking: Reported on 03/17/2016), Disp: 14 tablet, Rfl: 1 No Known Allergies  Social History  Substance Use Topics  . Smoking status: Never Smoker  . Smokeless tobacco: Not on file  . Alcohol use No    Family History  Problem Relation Age of Onset  . Hypertension Mother   . Congestive Heart Failure Mother   . Hypertension Father   . Congestive Heart Failure Father       Review of Systems  Constitutional: negative for fatigue and weight loss Respiratory: negative for cough and wheezing Cardiovascular: negative for chest pain, fatigue and palpitations Gastrointestinal: negative for abdominal pain and change in bowel habits Musculoskeletal:negative for myalgias Neurological: negative for gait problems and tremors Behavioral/Psych: negative for abusive relationship, depression Endocrine: negative for temperature intolerance    Genitourinary:positive for irregular cycles Integument/breast:  negative for breast lump, breast tenderness, nipple discharge and skin lesion(s)    Objective:       BP (!) 164/89   Pulse (!) 111   Ht 5\' 6"  (1.676 m)   Wt (!) 455 lb 6.4 oz (206.6 kg)   LMP 02/12/2017   BMI 73.50 kg/m  General:   alert  Skin:   no rash or abnormalities  Lungs:   clear to auscultation bilaterally  Heart:   regular rate and rhythm, S1, S2 normal, no murmur, click, rub or gallop  Breasts:   normal without suspicious masses, skin or nipple changes or axillary nodes  Abdomen:  normal findings: no  organomegaly, soft, non-tender and no hernia  Pelvis:  External genitalia: normal general appearance Urinary system: urethral meatus normal and bladder without fullness, nontender Vaginal: normal without tenderness, induration or masses Cervix: normal appearance.  Difficult exam due to obesity Adnexa: could not palpate due to obesity Uterus: could not palpate due to obesity   Lab Review Urine pregnancy test Labs reviewed yes Radiologic studies reviewed yes  50% of 20 min visit spent on counseling and coordination of care.    Assessment:    Healthy female exam.   Contraceptive Counseling and Advice  Obesity  Probable PCOS  Hypertension  Anemia  Plan:     Education reviewed: calcium supplements, depression evaluation, low fat, low cholesterol diet, safe sex/STD prevention, self breast exams and weight bearing exercise. Contraception: none. Follow up in: 1 year.   Meds ordered this encounter  Medications  . hydrochlorothiazide (HYDRODIURIL) 25 MG tablet    Sig: Take 25 mg by mouth daily.   No orders of the defined types were placed in this encounter.    Patient ID: Monique Hunter, female   DOB: [birth year], 36 y.o.   MRN: [id number]

## 2017-04-06 NOTE — Progress Notes (Signed)
Patient is in the office for annual exam, last pap 06-13-15. Pt states that she did not take BP med today. Patient reports vaginal irritation that includes itching and burning, denies discharge.

## 2017-04-07 LAB — CYTOLOGY - PAP
ADEQUACY: ABSENT
DIAGNOSIS: NEGATIVE
HPV: NOT DETECTED

## 2017-04-07 LAB — CERVICOVAGINAL ANCILLARY ONLY
Bacterial vaginitis: POSITIVE — AB
Candida vaginitis: NEGATIVE
Chlamydia: NEGATIVE
Neisseria Gonorrhea: NEGATIVE
TRICH (WINDOWPATH): NEGATIVE

## 2017-04-08 ENCOUNTER — Other Ambulatory Visit: Payer: Self-pay | Admitting: Obstetrics

## 2017-04-08 DIAGNOSIS — N760 Acute vaginitis: Principal | ICD-10-CM

## 2017-04-08 DIAGNOSIS — B9689 Other specified bacterial agents as the cause of diseases classified elsewhere: Secondary | ICD-10-CM

## 2017-04-08 MED ORDER — METRONIDAZOLE 500 MG PO TABS: 500.0000 mg | ORAL_TABLET | Freq: Two times a day (BID) | ORAL | 2 refills | Status: DC

## 2017-04-09 ENCOUNTER — Other Ambulatory Visit: Payer: Self-pay | Admitting: Obstetrics

## 2017-05-07 ENCOUNTER — Emergency Department (HOSPITAL_COMMUNITY)
Admission: EM | Admit: 2017-05-07 | Discharge: 2017-05-07 | Disposition: A | Discharge status: Home / Self Care | Payer: Medicaid Other | Attending: Emergency Medicine | Admitting: Emergency Medicine

## 2017-05-07 DIAGNOSIS — I10 Essential (primary) hypertension: Secondary | ICD-10-CM | POA: Diagnosis not present

## 2017-05-07 DIAGNOSIS — Z79899 Other long term (current) drug therapy: Secondary | ICD-10-CM | POA: Diagnosis not present

## 2017-05-07 DIAGNOSIS — N3000 Acute cystitis without hematuria: Secondary | ICD-10-CM | POA: Diagnosis not present

## 2017-05-07 DIAGNOSIS — N898 Other specified noninflammatory disorders of vagina: Secondary | ICD-10-CM | POA: Diagnosis present

## 2017-05-07 LAB — URINALYSIS, ROUTINE W REFLEX MICROSCOPIC
BILIRUBIN URINE: NEGATIVE
Glucose, UA: NEGATIVE mg/dL
KETONES UR: NEGATIVE mg/dL
Nitrite: NEGATIVE
Protein, ur: NEGATIVE mg/dL
Specific Gravity, Urine: 1.014 (ref 1.005–1.030)
pH: 5 (ref 5.0–8.0)

## 2017-05-07 LAB — WET PREP, GENITAL
Clue Cells Wet Prep HPF POC: NONE SEEN
Sperm: NONE SEEN
Trich, Wet Prep: NONE SEEN
YEAST WET PREP: NONE SEEN

## 2017-05-07 LAB — PREGNANCY, URINE: Preg Test, Ur: NEGATIVE

## 2017-05-07 MED ORDER — NITROFURANTOIN MONOHYD MACRO 100 MG PO CAPS: 100.0000 mg | ORAL_CAPSULE | Freq: Two times a day (BID) | ORAL | 0 refills | Status: DC

## 2017-05-07 NOTE — ED Notes (Signed)
Pt has high bp.  Notified PA.  Pt takes bp meds. Has not had today.  Asymptomatic for complications.

## 2017-05-07 NOTE — ED Triage Notes (Signed)
Pt c/o vaginal burning for 1 week. Recently dx with BV. Finished 2/3 of flagyl treatment- now has burning after having sex. Denies urinary symptoms.

## 2017-05-07 NOTE — Discharge Instructions (Signed)
Take macrobid as prescribed until all gone for urinary tract infection. You cultures still pending, if return abnormal we will call you. Follow up with your OB/GYN.

## 2017-05-07 NOTE — ED Provider Notes (Signed)
[facility]-EMERGENCY DEPT Provider Note   CSN: [id number] Arrival date & time: 05/07/17  0546     History   Chief Complaint Chief Complaint  Patient presents with  . Vaginitis    HPI Monique Hunter is a 36 y.o. female.  HPI Monique Hunter is a 36 y.o. female with history of anemia and hypertension, presents to emergency department complaining of vaginal burning. Patient states that her symptoms started 1 week ago. He states there is burning with intercourse. She denies any urinary symptoms. She denies any known sores or lesions. Denies any vaginal discharge. She states symptoms started shortly after finishing course of Flagyl for bacterial vaginosis that was diagnosed by her OB/GYN. She denies any pain. She states she called her OB/GYN yesterday but did not get an appointment for 2 weeks. She states that she is very uncomfortable and could not wait that long.  Past Medical History:  Diagnosis Date  . Anemia   . Hypertension     There are no active problems to display for this patient.   Past Surgical History:  Procedure Laterality Date  . CESAREAN SECTION    . HIP SURGERY Bilateral     OB History    Gravida Para Term Preterm AB Living   1 1 1     1    SAB TAB Ectopic Multiple Live Births           1       Home Medications    Prior to Admission medications   Medication Sig Start Date End Date Taking? Authorizing Provider  diphenhydramine-acetaminophen (TYLENOL PM) 25-500 MG TABS tablet Take 3 tablets by mouth at bedtime as needed.   Yes [provider]  ferrous sulfate 325 (65 FE) MG tablet Take 1 tablet (325 mg total) by mouth 2 (two) times daily with a meal. 09/04/13  Yes Cherrie DistanceSanford, Frances, PA-C  hydrochlorothiazide (MICROZIDE) 12.5 MG capsule Take 12.5 mg by mouth daily.    Yes [provider]  ibuprofen (ADVIL,MOTRIN) 200 MG tablet Take 800-1,200 mg by mouth every 6 (six) hours as needed.   Yes [provider]  metroNIDAZOLE (FLAGYL) 500 MG tablet  Take 1 tablet (500 mg total) by mouth 2 (two) times daily. 04/08/17  Yes Brock BadHarper, Charles A, MD    Family History Family History  Problem Relation Age of Onset  . Hypertension Mother   . Congestive Heart Failure Mother   . Hypertension Father   . Congestive Heart Failure Father     Social History Social History  Substance Use Topics  . Smoking status: Never Smoker  . Smokeless tobacco: Not on file  . Alcohol use No     Allergies   Patient has no known allergies.   Review of Systems Review of Systems  Constitutional: Negative for chills and fever.  Respiratory: Negative for cough, chest tightness and shortness of breath.   Cardiovascular: Negative for chest pain, palpitations and leg swelling.  Gastrointestinal: Negative for abdominal pain, diarrhea, nausea and vomiting.  Genitourinary: Positive for vaginal pain. Negative for dysuria, flank pain, pelvic pain, vaginal bleeding and vaginal discharge.  Musculoskeletal: Negative for arthralgias, myalgias, neck pain and neck stiffness.  Skin: Negative for rash.  Neurological: Negative for dizziness, weakness and headaches.  All other systems reviewed and are negative.    Physical Exam Updated Vital Signs Pulse (!) 101   Temp 98.5 F (36.9 C) (Oral)   Resp 17   SpO2 95%   Physical Exam  Constitutional: She is oriented  to person, place, and time. She appears well-developed and well-nourished. No distress.  HENT:  Head: Normocephalic.  Eyes: Conjunctivae are normal.  Neck: Neck supple.  Cardiovascular: Normal rate, regular rhythm and normal heart sounds.   Pulmonary/Chest: Effort normal and breath sounds normal. No respiratory distress. She has no wheezes. She has no rales.  Genitourinary:  Genitourinary Comments: Normal external genitalia. Slight irritation in the vaginal canal walls. Small thin white discharge. Cervix is normal, closed. No CMT. No uterine or adnexal tenderness. No masses palpated. Exam limited by body  habitus   Musculoskeletal: She exhibits no edema.  Neurological: She is alert and oriented to person, place, and time.  Skin: Skin is warm and dry.  Psychiatric: She has a normal mood and affect. Her behavior is normal.  Nursing note and vitals reviewed.    ED Treatments / Results  Labs (all labs ordered are listed, but only abnormal results are displayed) Labs Reviewed  WET PREP, GENITAL  URINALYSIS, ROUTINE W REFLEX MICROSCOPIC  POC URINE PREG, ED  GC/CHLAMYDIA PROBE AMP ([place]) NOT AT [facility]    EKG  EKG Interpretation None       Radiology No results found.  Procedures Procedures (including critical care time)  Medications Ordered in ED Medications - No data to display   Initial Impression / Assessment and Plan / ED Course  I have reviewed the triage vital signs and the nursing notes.  Pertinent labs & imaging results that were available during my care of the patient were reviewed by me and considered in my medical decision making (see chart for details).      Patient emerged Department of vaginal burning. Pelvic exam performed, showed mild skin irritation vaginal canal. No cervical motion tenderness. No uterine or adnexal tenderness, however exam was limited by body habitus. Wet prep showed few WBCs, negative yeast, Trichomonas, clue cells. GC and Chlamydia culture sent. Urinalysis shows many bacteria with moderate leukocytes. Will treat. Follow-up with family doctor.  2:00 PM Patient's blood pressure is very high. She states she has history of hypertension did not take her medications today. She is asymptomatic for her blood pressure. I instructed her to make sure she takes her medications and follow-up with family doctor for recheck.  Vitals:   05/07/17 0611 05/07/17 1355  BP:  (!) 200/114  Pulse: (!) 101 (!) 102  Resp: 17 18  Temp: 98.5 F (36.9 C)   TempSrc: Oral   SpO2: 95% 97%     Final Clinical Impressions(s) / ED Diagnoses   Final  diagnoses:  Acute cystitis without hematuria    New Prescriptions New Prescriptions   NITROFURANTOIN, MACROCRYSTAL-MONOHYDRATE, (MACROBID) 100 MG CAPSULE    Take 1 capsule (100 mg total) by mouth 2 (two) times daily.     Jaynie Crumble, PA-C 05/07/17 1403    Mesner, Barbara Cower, MD 05/08/17 [phone]

## 2017-05-09 LAB — GC/CHLAMYDIA PROBE AMP (~~LOC~~) NOT AT ARMC
CHLAMYDIA, DNA PROBE: NEGATIVE
Neisseria Gonorrhea: NEGATIVE

## 2017-06-14 IMAGING — CR DG CHEST 2V
2 series · 2 of 2 positions shown · non-contrast
Comparison: None.

CLINICAL DATA: Cold, congestion for 2 weeks, fever tonight. History
of hypertension.

EXAM:
CHEST  2 VIEW

[w chest pa]
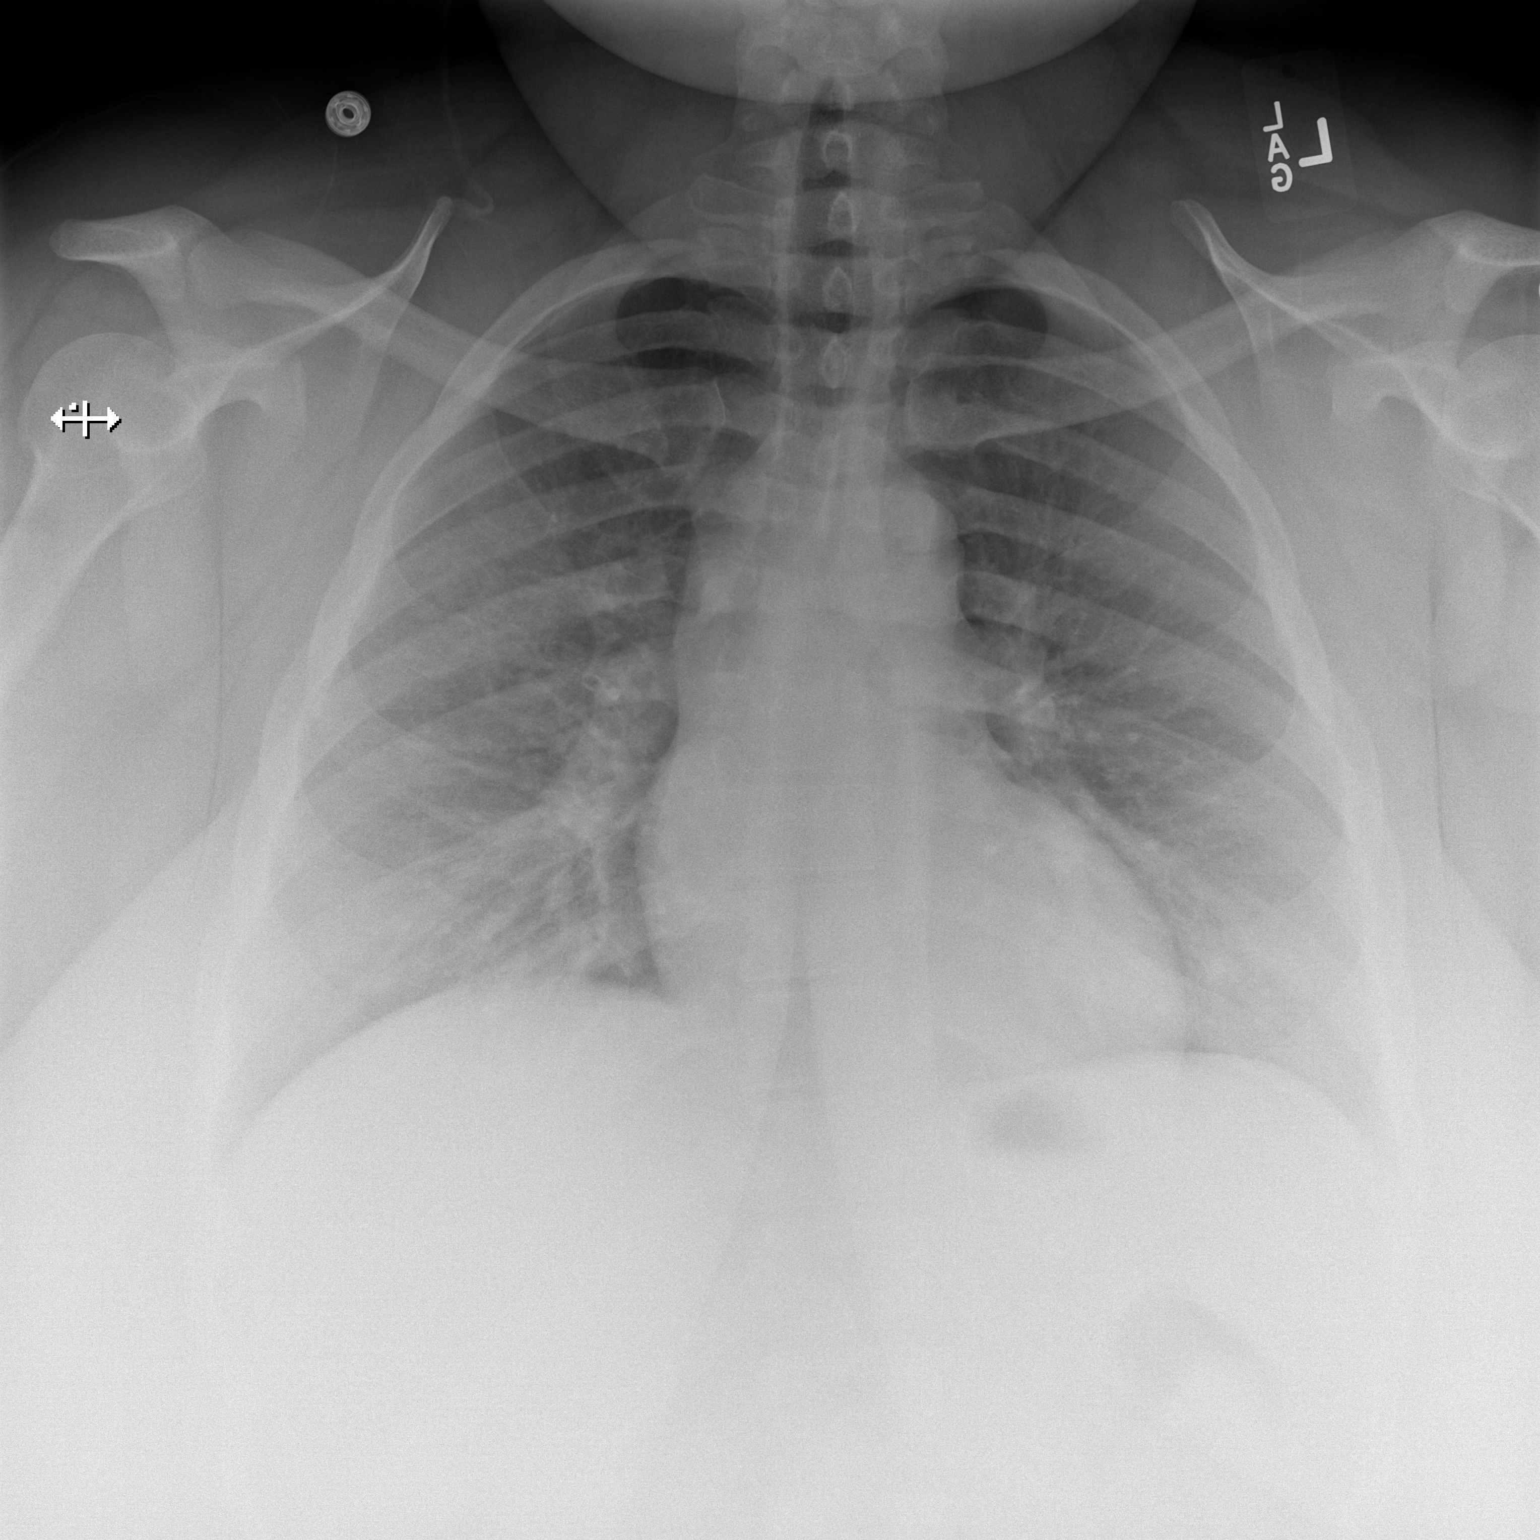

[w chest lat]
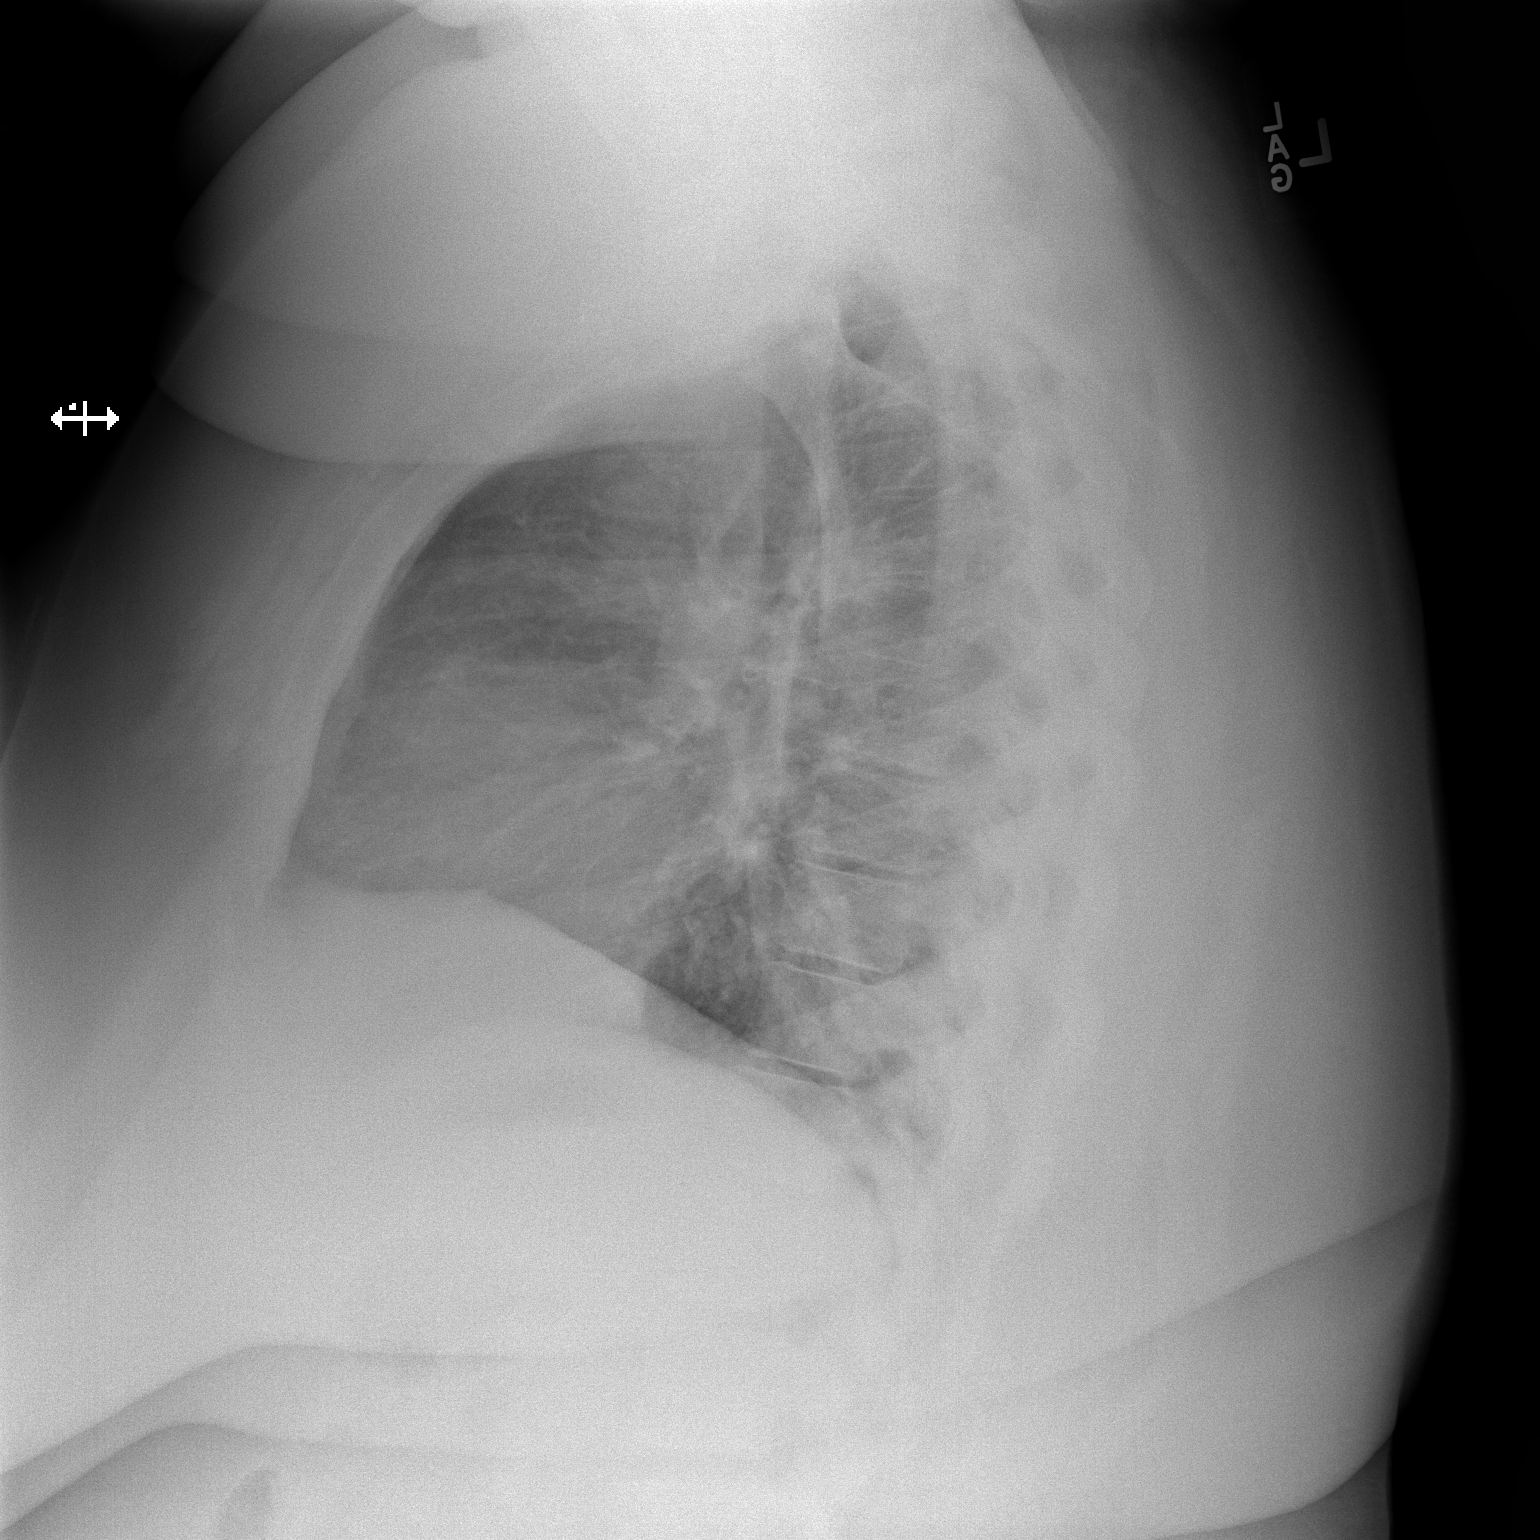

[2 of 2 positions shown; findings below may reference images not displayed]

FINDINGS: Large body habitus limits evaluation. Cardiomediastinal silhouette
is normal. The lungs are clear without pleural effusions or focal
consolidations. Trachea projects midline and there is no
pneumothorax. Soft tissue planes and included osseous structures are
non-suspicious.
IMPRESSION: No active cardiopulmonary disease on this habitus limited
examination.

## 2017-09-12 ENCOUNTER — Ambulatory Visit: Payer: Self-pay | Admitting: Obstetrics

## 2017-09-26 ENCOUNTER — Ambulatory Visit: Payer: Self-pay | Admitting: Obstetrics

## 2017-10-04 ENCOUNTER — Encounter: Payer: Self-pay | Admitting: Obstetrics

## 2017-10-04 ENCOUNTER — Ambulatory Visit: Payer: Medicaid Other | Admitting: Obstetrics

## 2017-10-04 VITALS — BP 160/118 | HR 118 | Ht 66.0 in | Wt >= 6400 oz

## 2017-10-04 DIAGNOSIS — I10 Essential (primary) hypertension: Secondary | ICD-10-CM | POA: Diagnosis not present

## 2017-10-04 DIAGNOSIS — Z3009 Encounter for other general counseling and advice on contraception: Secondary | ICD-10-CM

## 2017-10-04 DIAGNOSIS — N944 Primary dysmenorrhea: Secondary | ICD-10-CM | POA: Diagnosis not present

## 2017-10-04 DIAGNOSIS — Z6841 Body Mass Index (BMI) 40.0 and over, adult: Secondary | ICD-10-CM | POA: Diagnosis not present

## 2017-10-04 DIAGNOSIS — N939 Abnormal uterine and vaginal bleeding, unspecified: Secondary | ICD-10-CM | POA: Diagnosis not present

## 2017-10-04 DIAGNOSIS — E66813 Obesity, class 3: Secondary | ICD-10-CM

## 2017-10-04 MED ORDER — MEDROXYPROGESTERONE ACETATE 10 MG PO TABS: 10.0000 mg | ORAL_TABLET | Freq: Every day | ORAL | 0 refills | Status: DC

## 2017-10-04 MED ORDER — IBUPROFEN 800 MG PO TABS: 800.0000 mg | ORAL_TABLET | Freq: Three times a day (TID) | ORAL | 5 refills | Status: AC | PRN

## 2017-10-04 NOTE — Progress Notes (Signed)
Pt has been on her cycle since 09/03/17 with no break in bleeding. Pt desires to start pill.

## 2017-10-04 NOTE — Patient Instructions (Addendum)
Oral Contraception Information Oral contraceptive pills (OCPs) are medicines taken to prevent pregnancy. OCPs work by preventing the ovaries from releasing eggs. The hormones in OCPs also cause the cervical mucus to thicken, preventing the sperm from entering the uterus. The hormones also cause the uterine lining to become thin, not allowing a fertilized egg to attach to the inside of the uterus. OCPs are highly effective when taken exactly as prescribed. However, OCPs do not prevent sexually transmitted diseases (STDs). Safe sex practices, such as using condoms along with the pill, can help prevent STDs. Before taking the pill, you may have a physical exam and Pap test. Your health care provider may order blood tests. The health care provider will make sure you are a good candidate for oral contraception. Discuss with your health care provider the possible side effects of the OCP you may be prescribed. When starting an OCP, it can take 2 to 3 months for the body to adjust to the changes in hormone levels in your body. Types of oral contraception  The combination pill-This pill contains estrogen and progestin (synthetic progesterone) hormones. The combination pill comes in 21-day, 28-day, or 91-day packs. Some types of combination pills are meant to be taken continuously (365-day pills). With 21-day packs, you do not take pills for 7 days after the last pill. With 28-day packs, the pill is taken every day. The last 7 pills are without hormones. Certain types of pills have more than 21 hormone-containing pills. With 91-day packs, the first 84 pills contain both hormones, and the last 7 pills contain no hormones or contain estrogen only.  The minipill-This pill contains the progesterone hormone only. The pill is taken every day continuously. It is very important to take the pill at the same time each day. The minipill comes in packs of 28 pills. All 28 pills contain the hormone. Advantages of oral  contraceptive pills  Decreases premenstrual symptoms.  Treats menstrual period cramps.  Regulates the menstrual cycle.  Decreases a heavy menstrual flow.  May treatacne, depending on the type of pill.  Treats abnormal uterine bleeding.  Treats polycystic ovarian syndrome.  Treats endometriosis.  Can be used as emergency contraception. Things that can make oral contraceptive pills less effective OCPs can be less effective if:  You forget to take the pill at the same time every day.  You have a stomach or intestinal disease that lessens the absorption of the pill.  You take OCPs with other medicines that make OCPs less effective, such as antibiotics, certain HIV medicines, and some seizure medicines.  You take expired OCPs.  You forget to restart the pill on day 7, when using the packs of 21 pills.  Risks associated with oral contraceptive pills Oral contraceptive pills can sometimes cause side effects, such as:  Headache.  Nausea.  Breast tenderness.  Irregular bleeding or spotting.  Combination pills are also associated with a small increased risk of:  Blood clots.  Heart attack.  Stroke.  This information is not intended to replace advice given to you by your health care provider. Make sure you discuss any questions you have with your health care provider. Document Released: 01/15/2003 Document Revised: 04/01/2016 Document Reviewed: 04/15/2013 Elsevier Interactive Patient Education  2018 [employer]  Oral Contraception Use Oral contraceptive pills (OCPs) are medicines taken to prevent pregnancy. OCPs work by preventing the ovaries from releasing eggs. The hormones in OCPs also cause the cervical mucus to thicken, preventing the sperm from entering the uterus. The  hormones also cause the uterine lining to become thin, not allowing a fertilized egg to attach to the inside of the uterus. OCPs are highly effective when taken exactly as prescribed. However,  OCPs do not prevent sexually transmitted diseases (STDs). Safe sex practices, such as using condoms along with an OCP, can help prevent STDs. Before taking OCPs, you may have a physical exam and Pap test. Your health care provider may also order blood tests if necessary. Your health care provider will make sure you are a good candidate for oral contraception. Discuss with your health care provider the possible side effects of the OCP you may be prescribed. When starting an OCP, it can take 2 to 3 months for the body to adjust to the changes in hormone levels in your body. How to take oral contraceptive pills Your health care provider may advise you on how to start taking the first cycle of OCPs. Otherwise, you can:  Start on day 1 of your menstrual period. You will not need any backup contraceptive protection with this start time.  Start on the first Sunday after your menstrual period or the day you get your prescription. In these cases, you will need to use backup contraceptive protection for the first week.  Start the pill at any time of your cycle. If you take the pill within 5 days of the start of your period, you are protected against pregnancy right away. In this case, you will not need a backup form of birth control. If you start at any other time of your menstrual cycle, you will need to use another form of birth control for 7 days. If your OCP is the type called a minipill, it will protect you from pregnancy after taking it for 2 days (48 hours).  After you have started taking OCPs:  If you forget to take 1 pill, take it as soon as you remember. Take the next pill at the regular time.  If you miss 2 or more pills, call your health care provider because different pills have different instructions for missed doses. Use backup birth control until your next menstrual period starts.  If you use a 28-day pack that contains inactive pills and you miss 1 of the last 7 pills (pills with no  hormones), it will not matter. Throw away the rest of the non-hormone pills and start a new pill pack.  No matter which day you start the OCP, you will always start a new pack on that same day of the week. Have an extra pack of OCPs and a backup contraceptive method available in case you miss some pills or lose your OCP pack. Follow these instructions at home:  Do not smoke.  Always use a condom to protect against STDs. OCPs do not protect against STDs.  Use a calendar to mark your menstrual period days.  Read the information and directions that came with your OCP. Talk to your health care provider if you have questions. Contact a health care provider if:  You develop nausea and vomiting.  You have abnormal vaginal discharge or bleeding.  You develop a rash.  You miss your menstrual period.  You are losing your hair.  You need treatment for mood swings or depression.  You get dizzy when taking the OCP.  You develop acne from taking the OCP.  You become pregnant. Get help right away if:  You develop chest pain.  You develop shortness of breath.  You have an uncontrolled or  severe headache.  You develop numbness or slurred speech.  You develop visual problems.  You develop pain, redness, and swelling in the legs. This information is not intended to replace advice given to you by your health care provider. Make sure you discuss any questions you have with your health care provider. Document Released: 10/14/2011 Document Revised: 04/01/2016 Document Reviewed: 04/15/2013 Elsevier Interactive Patient Education  2017 Else[employer] Inc.  Abnormal Uterine Bleeding Abnormal uterine bleeding can affect women at various stages in life, including teenagers, women in their reproductive years, pregnant women, and women who have reached menopause. Several kinds of uterine bleeding are considered abnormal, including:  Bleeding or spotting between periods.  Bleeding after sexual  intercourse.  Bleeding that is heavier or more than normal.  Periods that last longer than usual.  Bleeding after menopause.  Many cases of abnormal uterine bleeding are minor and simple to treat, while others are more serious. Any type of abnormal bleeding should be evaluated by your health care provider. Treatment will depend on the cause of the bleeding. Follow these instructions at home: Monitor your condition for any changes. The following actions may help to alleviate any discomfort you are experiencing:  Avoid the use of tampons and douches as directed by your health care provider.  Change your pads frequently.  You should get regular pelvic exams and Pap tests. Keep all follow-up appointments for diagnostic tests as directed by your health care provider. Contact a health care provider if:  Your bleeding lasts more than 1 week.  You feel dizzy at times. Get help right away if:  You pass out.  You are changing pads every 15 to 30 minutes.  You have abdominal pain.  You have a fever.  You become sweaty or weak.  You are passing large blood clots from the vagina.  You start to feel nauseous and vomit. This information is not intended to replace advice given to you by your health care provider. Make sure you discuss any questions you have with your health care provider. Document Released: 10/25/2005 Document Revised: 04/07/2016 Document Reviewed: 05/24/2013 Elsevier Interactive Patient Education  2017 Elsevier Inc. Dysmenorrhea Menstrual cramps (dysmenorrhea) are caused by the muscles of the uterus tightening (contracting) during a menstrual period. For some women, this discomfort is merely bothersome. For others, dysmenorrhea can be severe enough to interfere with everyday activities for a few days each month. Primary dysmenorrhea is menstrual cramps that last a couple of days when you start having menstrual periods or soon after. This often begins after a teenager  starts having her period. As a woman gets older or has a baby, the cramps will usually lessen or disappear. Secondary dysmenorrhea begins later in life, lasts longer, and the pain may be stronger than primary dysmenorrhea. The pain may start before the period and last a few days after the period. What are the causes? Dysmenorrhea is usually caused by an underlying problem, such as:  The tissue lining the uterus grows outside of the uterus in other areas of the body (endometriosis).  The endometrial tissue, which normally lines the uterus, is found in or grows into the muscular walls of the uterus (adenomyosis).  The pelvic blood vessels are engorged with blood just before the menstrual period (pelvic congestive syndrome).  Overgrowth of cells (polyps) in the lining of the uterus or cervix.  Falling down of the uterus (prolapse) because of loose or stretched ligaments.  Depression.  Bladder problems, infection, or inflammation.  Problems with the intestine,  a tumor, or irritable bowel syndrome.  Cancer of the female organs or bladder.  A severely tipped uterus.  A very tight opening or closed cervix.  Noncancerous tumors of the uterus (fibroids).  Pelvic inflammatory disease (PID).  Pelvic scarring (adhesions) from a previous surgery.  Ovarian cyst.  An intrauterine device (IUD) used for birth control.  What increases the risk? You may be at greater risk of dysmenorrhea if:  You are younger than age 34.  You started puberty early.  You have irregular or heavy bleeding.  You have never given birth.  You have a family history of this problem.  You are a smoker.  What are the signs or symptoms?  Cramping or throbbing pain in your lower abdomen.  Headaches.  Lower back pain.  Nausea or vomiting.  Diarrhea.  Sweating or dizziness.  Loose stools. How is this diagnosed? A diagnosis is based on your history, symptoms, physical exam, diagnostic tests, or  procedures. Diagnostic tests or procedures may include:  Blood tests.  Ultrasonography.  An examination of the lining of the uterus (dilation and curettage, D&C).  An examination inside your abdomen or pelvis with a scope (laparoscopy).  X-rays.  CT scan.  MRI.  An examination inside the bladder with a scope (cystoscopy).  An examination inside the intestine or stomach with a scope (colonoscopy, gastroscopy).  How is this treated? Treatment depends on the cause of the dysmenorrhea. Treatment may include:  Pain medicine prescribed by your health care provider.  Birth control pills or an IUD with progesterone hormone in it.  Hormone replacement therapy.  Nonsteroidal anti-inflammatory drugs (NSAIDs). These may help stop the production of prostaglandins.  Surgery to remove adhesions, endometriosis, ovarian cyst, or fibroids.  Removal of the uterus (hysterectomy).  Progesterone shots to stop the menstrual period.  Cutting the nerves on the sacrum that go to the female organs (presacral neurectomy).  Electric current to the sacral nerves (sacral nerve stimulation).  Antidepressant medicine.  Psychiatric therapy, counseling, or group therapy.  Exercise and physical therapy.  Meditation and yoga therapy.  Acupuncture.  Follow these instructions at home:  Only take over-the-counter or prescription medicines as directed by your health care provider.  Place a heating pad or hot water bottle on your lower back or abdomen. Do not sleep with the heating pad.  Use aerobic exercises, walking, swimming, biking, and other exercises to help lessen the cramping.  Massage to the lower back or abdomen may help.  Stop smoking.  Avoid alcohol and caffeine. Contact a health care provider if:  Your pain does not get better with medicine.  You have pain with sexual intercourse.  Your pain increases and is not controlled with medicines.  You have abnormal vaginal bleeding  with your period.  You develop nausea or vomiting with your period that is not controlled with medicine. Get help right away if: You pass out. This information is not intended to replace advice given to you by your health care provider. Make sure you discuss any questions you have with your health care provider. Document Released: 10/25/2005 Document Revised: 04/01/2016 Document Reviewed: 04/12/2013 Elsevier Interactive Patient Education  2017 [employer]

## 2017-10-04 NOTE — Progress Notes (Signed)
Subjective:    Monique Hunter is a 36 y.o. female who presents for contraception counseling. The patient has irregular and heavy periods. The patient is not sexually active. Pertinent past medical history: hypertension.and morbid obesity  The information documented in the HPI was reviewed and verified.  Menstrual History: OB History    Gravida Para Term Preterm AB Living   1 1 1     1    SAB TAB Ectopic Multiple Live Births           1      Patient's last menstrual period was 09/03/2017.   There are no active problems to display for this patient.  Past Medical History:  Diagnosis Date  . Anemia   . Hypertension     Past Surgical History:  Procedure Laterality Date  . CESAREAN SECTION    . HIP SURGERY Bilateral      Current Outpatient Medications:  .  diphenhydramine-acetaminophen (TYLENOL PM) 25-500 MG TABS tablet, Take 3 tablets by mouth at bedtime as needed., Disp: , Rfl:  .  ferrous sulfate 325 (65 FE) MG tablet, Take 1 tablet (325 mg total) by mouth 2 (two) times daily with a meal., Disp: 60 tablet, Rfl: 3 .  hydrochlorothiazide (HYDRODIURIL) 25 MG tablet, TK 1 T PO  QD, Disp: , Rfl: 5 .  ibuprofen (ADVIL,MOTRIN) 800 MG tablet, Take 1 tablet (800 mg total) by mouth every 8 (eight) hours as needed., Disp: 40 tablet, Rfl: 5 .  medroxyPROGESTERone (PROVERA) 10 MG tablet, Take 1 tablet (10 mg total) by mouth daily., Disp: 10 tablet, Rfl: 0 No Known Allergies  Social History   Tobacco Use  . Smoking status: Never Smoker  . Smokeless tobacco: Never Used  Substance Use Topics  . Alcohol use: No    Alcohol/week: 0.0 oz    Family History  Problem Relation Age of Onset  . Hypertension Mother   . Congestive Heart Failure Mother   . Hypertension Father   . Congestive Heart Failure Father        Review of Systems Constitutional: negative for weight loss Genitourinary:positive for abnormal menstrual periods, and negative for vaginal discharge   Objective:   BP (!)  160/118   Pulse (!) 118   Ht 5\' 6"  (1.676 m)   Wt (!) 472 lb 6.4 oz (214.3 kg)   LMP 09/03/2017   BMI 76.25 kg/m    PE:  Deferred  Lab Review Urine pregnancy test Labs reviewed yes Radiologic studies reviewed no  >50% of 15 min visit spent on counseling and coordination of care.    Assessment:    36 y.o., presents for counseling and advice on OCP (estrogen/progesterone), contraindications Hypertension, uncontrolled.     1. Abnormal uterine bleeding (AUB) Rx: - medroxyPROGESTERone (PROVERA) 10 MG tablet; Take 1 tablet (10 mg total) by mouth daily.  Dispense: 10 tablet; Refill: 0 - US PELVIC COMPLETE WITH TRANSVAGINAL; Future  2. Encounter for other general counseling and advice on contraception - hormonal contraceptives not recommended at this time unless BP is well controlled and patient is compliant with meds  3. Class 3 severe obesity due to excess calories without serious comorbidity with body mass index (BMI) greater than or equal to 70 in adult [facility]) - dietary program of caloric reduction, exercise and behavioral modification recommended - may be a good candidate for Bariatric Surgery  4. Primary dysmenorrhea Rx: - ibuprofen (ADVIL,MOTRIN) 800 MG tablet; Take 1 tablet (800 mg total) by mouth every 8 (eight) hours as  needed.  Dispense: 40 tablet; Refill: 5  5. HTN (hypertension), benign Rx: - hydrochlorothiazide (HYDRODIURIL) 25 MG tablet; TK 1 T PO  QD; Refill: 5 - followed by PCP - noncompliant with meds  Plan:    All questions answered. Contraception: abstinence. Diagnosis explained in detail, including differential. Discussed healthy lifestyle modifications. Agricultural engineerducational material distributed.   Follow up in 2 weeks. Recheck BP.and further discuss contraception.  Meds ordered this encounter  Medications  . medroxyPROGESTERone (PROVERA) 10 MG tablet    Sig: Take 1 tablet (10 mg total) by mouth daily.    Dispense:  10 tablet    Refill:  0  . ibuprofen  (ADVIL,MOTRIN) 800 MG tablet    Sig: Take 1 tablet (800 mg total) by mouth every 8 (eight) hours as needed.    Dispense:  40 tablet    Refill:  5   Orders Placed This Encounter  Procedures  . US PELVIC COMPLETE WITH TRANSVAGINAL    Standing Status:   Future    Standing Expiration Date:   12/04/2018    Order Specific Question:   Reason for Exam (SYMPTOM  OR DIAGNOSIS REQUIRED)    Answer:   AUB    Order Specific Question:   Preferred imaging location?    Answer[facility]

## 2017-10-06 ENCOUNTER — Ambulatory Visit (HOSPITAL_COMMUNITY): Payer: Medicaid Other | Attending: Obstetrics

## 2017-10-14 ENCOUNTER — Ambulatory Visit (HOSPITAL_COMMUNITY)
Admission: RE | Admit: 2017-10-14 | Discharge: 2017-10-14 | Disposition: A | Discharge status: Home / Self Care | Payer: Medicaid Other | Source: Ambulatory Visit | Attending: Obstetrics | Admitting: Obstetrics

## 2017-10-14 DIAGNOSIS — N939 Abnormal uterine and vaginal bleeding, unspecified: Secondary | ICD-10-CM | POA: Diagnosis not present

## 2017-10-18 ENCOUNTER — Ambulatory Visit: Payer: Medicaid Other | Admitting: Obstetrics

## 2017-10-20 ENCOUNTER — Encounter: Payer: Self-pay | Admitting: Obstetrics

## 2017-10-20 ENCOUNTER — Ambulatory Visit (INDEPENDENT_AMBULATORY_CARE_PROVIDER_SITE_OTHER): Payer: Medicaid Other | Admitting: Obstetrics

## 2017-10-20 VITALS — Wt >= 6400 oz

## 2017-10-20 DIAGNOSIS — D500 Iron deficiency anemia secondary to blood loss (chronic): Secondary | ICD-10-CM

## 2017-10-20 DIAGNOSIS — Z30011 Encounter for initial prescription of contraceptive pills: Secondary | ICD-10-CM | POA: Diagnosis not present

## 2017-10-20 DIAGNOSIS — Z6841 Body Mass Index (BMI) 40.0 and over, adult: Secondary | ICD-10-CM

## 2017-10-20 DIAGNOSIS — N939 Abnormal uterine and vaginal bleeding, unspecified: Secondary | ICD-10-CM | POA: Diagnosis not present

## 2017-10-20 DIAGNOSIS — Z3009 Encounter for other general counseling and advice on contraception: Secondary | ICD-10-CM

## 2017-10-20 MED ORDER — MEDROXYPROGESTERONE ACETATE 10 MG PO TABS: 20.0000 mg | ORAL_TABLET | Freq: Every day | ORAL | 0 refills | Status: AC

## 2017-10-20 MED ORDER — CITRANATAL HARMONY 27-1-260 MG PO CAPS: 1.0000 | ORAL_CAPSULE | Freq: Every day | ORAL | 11 refills | Status: AC

## 2017-10-20 MED ORDER — NORETHIN-ETH ESTRAD-FE BIPHAS 1 MG-10 MCG / 10 MCG PO TABS: 1.0000 | ORAL_TABLET | Freq: Every day | ORAL | 11 refills | Status: DC

## 2017-10-20 NOTE — Progress Notes (Signed)
Patient ID: Monique Hunter, female   DOB: 1981-11-04, 36 y.o.   MRN: [id number]  Chief Complaint  Patient presents with  . Results    US 09/2017    HPI Monique Sessionrika Hirth is a 36 y.o. female.  History of AUB and subsequent severe anemia.  She is morbidly obese and has anovulatory cycles.  Presents today for ultrasound results. HPI  Past Medical History:  Diagnosis Date  . Anemia   . Hypertension     Past Surgical History:  Procedure Laterality Date  . CESAREAN SECTION    . HIP SURGERY Bilateral     Family History  Problem Relation Age of Onset  . Hypertension Mother   . Congestive Heart Failure Mother   . Hypertension Father   . Congestive Heart Failure Father     Social History Social History   Tobacco Use  . Smoking status: Never Smoker  . Smokeless tobacco: Never Used  Substance Use Topics  . Alcohol use: No    Alcohol/week: 0.0 oz  . Drug use: No    No Known Allergies  Current Outpatient Medications  Medication Sig Dispense Refill  . diphenhydramine-acetaminophen (TYLENOL PM) 25-500 MG TABS tablet Take 3 tablets by mouth at bedtime as needed.    . ferrous sulfate 325 (65 FE) MG tablet Take 1 tablet (325 mg total) by mouth 2 (two) times daily with a meal. 60 tablet 3  . hydrochlorothiazide (HYDRODIURIL) 25 MG tablet TK 1 T PO  QD  5  . ibuprofen (ADVIL,MOTRIN) 800 MG tablet Take 1 tablet (800 mg total) by mouth every 8 (eight) hours as needed. 40 tablet 5  . medroxyPROGESTERone (PROVERA) 10 MG tablet Take 1 tablet (10 mg total) by mouth daily. 10 tablet 0  . medroxyPROGESTERone (PROVERA) 10 MG tablet Take 2 tablets (20 mg total) by mouth daily. 20 tablet 0  . Norethindrone-Ethinyl Estradiol-Fe Biphas (LO LOESTRIN FE) 1 MG-10 MCG / 10 MCG tablet Take 1 tablet by mouth daily. Start taking pills on first day of period. 1 Package 11  . Prenat-FeFmCb-DSS-FA-DHA w/o A (CITRANATAL HARMONY) 27-1-260 MG CAPS Take 1 capsule by mouth daily before breakfast. 30 capsule 11   No  current facility-administered medications for this visit.     Review of Systems Review of Systems Constitutional: negative for fatigue and weight loss Respiratory: negative for cough and wheezing Cardiovascular: negative for chest pain, fatigue and palpitations Gastrointestinal: negative for abdominal pain and change in bowel habits Genitourinary:positive for heavy periods Integument/breast: negative for nipple discharge Musculoskeletal:negative for myalgias Neurological: negative for gait problems and tremors Behavioral/Psych: negative for abusive relationship, depression Endocrine: negative for temperature intolerance      Weight (!) 464 lb (210.5 kg).  Physical Exam Physical Exam :  Deferred  50% of 10 min visit spent on counseling and coordination of care.   Data Reviewed Ultrasound:     US PELVIC COMPLETE WITH TRANSVAGINAL (Accession [id number]) (Order [id number])  Imaging  Date: 10/14/2017 [facility] Released By: Tylene FantasiaJohnson, Carnetra, RT Authorizing: Brock BadHarper, Charles A, MD  Exam Information   Status Exam Begun  Exam Ended   Final [99] 10/14/2017 9:47 AM 10/14/2017 10:33 AM  PACS Images   Show images for US PELVIC COMPLETE WITH TRANSVAGINAL  Study Result   CLINICAL DATA:  Abnormal uterine bleeding, heavy cycles  EXAM: TRANSABDOMINAL AND TRANSVAGINAL ULTRASOUND OF PELVIS  TECHNIQUE: Both transabdominal and transvaginal ultrasound examinations of the pelvis were performed. Transabdominal technique was performed for global imaging  of the pelvis including uterus, ovaries, adnexal regions, and pelvic cul-de-sac. It was necessary to proceed with endovaginal exam following the transabdominal exam to visualize the uterus, endometrium, ovaries and adnexa .  COMPARISON:  None  FINDINGS: Uterus  Measurements: 9.8 x 5.2 x 6.2 cm. No fibroids or other mass visualized.  Endometrium  Thickness: Normal thickness, 8  mm.  No focal abnormality visualized.  Right ovary  Measurements: Not visualized. No adnexal mass seen.  Left ovary  Measurements: Not visualized. No adnexal mass seen.  Other findings  No abnormal free fluid.  IMPRESSION: Unremarkable pelvic ultrasound. Ovaries not visible. If bleeding remains unresponsive to hormonal or medical therapy, sonohysterogram should be considered for focal lesion work-up. (Ref: Radiological Reasoning: Algorithmic Workup of Abnormal Vaginal Bleeding with Endovaginal Sonography and Sonohysterography. AJR 2008; 161:W96-04; 191:S68-73)   Electronically Signed   By: Charlett NoseKevin  Dover M.D.   On: 10/14/2017 10:57   Assessment and Plan:    1. Abnormal uterine bleeding (AUB) Rx: - medroxyPROGESTERone (PROVERA) 10 MG tablet; Take 2 tablets (20 mg total) by mouth daily.  Dispense: 20 tablet; Refill: 0  2. Iron deficiency anemia due to chronic blood loss Rx: - Prenat-FeFmCb-DSS-FA-DHA w/o A (CITRANATAL HARMONY) 27-1-260 MG CAPS; Take 1 capsule by mouth daily before breakfast.  Dispense: 30 capsule; Refill: 11  3. Class 3 severe obesity due to excess calories without serious comorbidity with body mass index (BMI) greater than or equal to 70 in adult [facility](HCC) - program of caloric reduction, exercise and behavioral modification recommended - Bariatric Surgery referral in the future  4. Encounter for other general counseling and advice on contraception - wants OCP's  5. Encounter for initial prescription of contraceptive pills Rx: - Norethindrone-Ethinyl Estradiol-Fe Biphas (LO LOESTRIN FE) 1 MG-10 MCG / 10 MCG tablet; Take 1 tablet by mouth daily. Start taking pills on first day of period.  Dispense: 1 Package; Refill: 11    Plan    Follow up in 4 months  No orders of the defined types were placed in this encounter.  Meds ordered this encounter  Medications  . Norethindrone-Ethinyl Estradiol-Fe Biphas (LO LOESTRIN FE) 1 MG-10 MCG / 10 MCG tablet    Sig:  Take 1 tablet by mouth daily. Start taking pills on first day of period.    Dispense:  1 Package    Refill:  11    Submit other coverage code 3  BIN:  [id number]  PCN:  [id number]   GRP:  [id number]   :  [id number]:  [id number]  . medroxyPROGESTERone (PROVERA) 10 MG tablet    Sig: Take 2 tablets (20 mg total) by mouth daily.    Dispense:  20 tablet    Refill:  0  . Prenat-FeFmCb-DSS-FA-DHA w/o A (CITRANATAL HARMONY) 27-1-260 MG CAPS    Sig: Take 1 capsule by mouth daily before breakfast.    Dispense:  30 capsule    Refill:  11

## 2017-10-28 ENCOUNTER — Other Ambulatory Visit: Payer: Self-pay | Admitting: Obstetrics

## 2017-10-28 ENCOUNTER — Other Ambulatory Visit: Payer: Self-pay | Admitting: Pediatrics

## 2017-10-28 DIAGNOSIS — N939 Abnormal uterine and vaginal bleeding, unspecified: Secondary | ICD-10-CM

## 2017-10-28 NOTE — Telephone Encounter (Signed)
Pt called in stating her period is heavy again and requested refill on Provera. Please advise.  Last filled 10/20/17.

## 2017-11-10 ENCOUNTER — Other Ambulatory Visit: Payer: Self-pay | Admitting: Obstetrics

## 2017-11-10 DIAGNOSIS — N939 Abnormal uterine and vaginal bleeding, unspecified: Secondary | ICD-10-CM

## 2017-11-11 ENCOUNTER — Telehealth: Payer: Self-pay | Admitting: Obstetrics

## 2017-11-11 NOTE — Telephone Encounter (Signed)
Pt called stating she would like to continue taking Provera monthly. She reports is controlled her bleeding very well. She is requesting to use it long term.

## 2017-11-12 ENCOUNTER — Other Ambulatory Visit: Payer: Self-pay | Admitting: Obstetrics

## 2017-11-12 NOTE — Telephone Encounter (Signed)
She is taking OCP's.  Can't take OCP's, and Provera long term.

## 2017-11-14 NOTE — Telephone Encounter (Signed)
Pt informed

## 2018-02-27 ENCOUNTER — Emergency Department (HOSPITAL_COMMUNITY): Payer: Medicaid Other

## 2018-02-27 ENCOUNTER — Emergency Department (HOSPITAL_COMMUNITY)
Admission: EM | Admit: 2018-02-27 | Discharge: 2018-02-27 | Disposition: A | Discharge status: Home / Self Care | Payer: Medicaid Other | Attending: Emergency Medicine | Admitting: Emergency Medicine

## 2018-02-27 ENCOUNTER — Encounter (HOSPITAL_COMMUNITY): Payer: Self-pay | Admitting: *Deleted

## 2018-02-27 DIAGNOSIS — R51 Headache: Secondary | ICD-10-CM | POA: Diagnosis present

## 2018-02-27 DIAGNOSIS — Z79899 Other long term (current) drug therapy: Secondary | ICD-10-CM | POA: Diagnosis not present

## 2018-02-27 DIAGNOSIS — G44229 Chronic tension-type headache, not intractable: Secondary | ICD-10-CM | POA: Diagnosis not present

## 2018-02-27 DIAGNOSIS — I10 Essential (primary) hypertension: Secondary | ICD-10-CM | POA: Insufficient documentation

## 2018-02-27 LAB — COMPREHENSIVE METABOLIC PANEL
ALK PHOS: 62 U/L (ref 38–126)
ALT: 216 U/L — AB (ref 14–54)
ANION GAP: 16 — AB (ref 5–15)
AST: 122 U/L — ABNORMAL HIGH (ref 15–41)
Albumin: 4 g/dL (ref 3.5–5.0)
BUN: 14 mg/dL (ref 6–20)
CALCIUM: 9.7 mg/dL (ref 8.9–10.3)
CHLORIDE: 102 mmol/L (ref 101–111)
CO2: 19 mmol/L — AB (ref 22–32)
CREATININE: 0.69 mg/dL (ref 0.44–1.00)
Glucose, Bld: 110 mg/dL — ABNORMAL HIGH (ref 65–99)
Potassium: 3.2 mmol/L — ABNORMAL LOW (ref 3.5–5.1)
SODIUM: 137 mmol/L (ref 135–145)
Total Bilirubin: 0.5 mg/dL (ref 0.3–1.2)
Total Protein: 9 g/dL — ABNORMAL HIGH (ref 6.5–8.1)

## 2018-02-27 LAB — CBC WITH DIFFERENTIAL/PLATELET
BASOS PCT: 0 %
Basophils Absolute: 0 10*3/uL (ref 0.0–0.1)
EOS ABS: 0.3 10*3/uL (ref 0.0–0.7)
EOS PCT: 4 %
HCT: 32 % — ABNORMAL LOW (ref 36.0–46.0)
Hemoglobin: 9.4 g/dL — ABNORMAL LOW (ref 12.0–15.0)
Lymphocytes Relative: 22 %
Lymphs Abs: 1.7 10*3/uL (ref 0.7–4.0)
MCH: 17.9 pg — AB (ref 26.0–34.0)
MCHC: 29.4 g/dL — AB (ref 30.0–36.0)
MCV: 60.8 fL — AB (ref 78.0–100.0)
MONO ABS: 0.5 10*3/uL (ref 0.1–1.0)
Monocytes Relative: 6 %
NEUTROS ABS: 5.3 10*3/uL (ref 1.7–7.7)
Neutrophils Relative %: 68 %
PLATELETS: 546 10*3/uL — AB (ref 150–400)
RBC: 5.26 MIL/uL — ABNORMAL HIGH (ref 3.87–5.11)
RDW: 19.9 % — ABNORMAL HIGH (ref 11.5–15.5)
WBC: 7.8 10*3/uL (ref 4.0–10.5)

## 2018-02-27 MED ORDER — METOCLOPRAMIDE HCL 5 MG/ML IJ SOLN
5.0000 mg | Freq: Once | INTRAMUSCULAR | Status: AC
  Administered 2018-02-27: 5 mg via INTRAVENOUS
  Filled 2018-02-27: qty 2

## 2018-02-27 MED ORDER — METOCLOPRAMIDE HCL 5 MG PO TABS: 5.0000 mg | ORAL_TABLET | Freq: Four times a day (QID) | ORAL | 0 refills | Status: AC | PRN

## 2018-02-27 MED ORDER — DIPHENHYDRAMINE HCL 50 MG/ML IJ SOLN
12.5000 mg | Freq: Once | INTRAMUSCULAR | Status: AC
  Administered 2018-02-27: 12.5 mg via INTRAVENOUS
  Filled 2018-02-27: qty 1

## 2018-02-27 MED ORDER — SODIUM CHLORIDE 0.9 % IV SOLN
INTRAVENOUS | Status: DC
  Administered 2018-02-27: 10:00:00 via INTRAVENOUS

## 2018-02-27 MED ORDER — POTASSIUM CHLORIDE CRYS ER 20 MEQ PO TBCR
40.0000 meq | EXTENDED_RELEASE_TABLET | Freq: Once | ORAL | Status: AC
  Administered 2018-02-27: 40 meq via ORAL
  Filled 2018-02-27: qty 2

## 2018-02-27 NOTE — ED Triage Notes (Signed)
Pt c/o h/a's x 1 month, has PCP appointment on Thursday, has had some nausea, sensitivity to light.

## 2018-02-27 NOTE — ED Provider Notes (Addendum)
[facility]-EMERGENCY DEPT Provider Note   CSN: [id number] Arrival date & time: 02/27/18  0136     History   Chief Complaint Chief Complaint  Patient presents with  . Headache    HPI Monique Hunter is a 37 y.o. female.  37 year old female presents with one-month history of left-sided headache characterizes throbbing has now spread to the right forehead.  Headache is better in the morning and progresses throughout the day.  Has had nausea but no vomiting.  No fever or chills.  No neck pain but some photophobia.  Denies any visual changes such as blurred vision or vision loss.  Denies any eye pain.  Pain is not temporal.  No ataxia or unilateral weakness.  Has used over-the-counter medications without relief.  Patient scheduled see her physician for this with first time this week but came in today because of worsening symptoms.     Past Medical History:  Diagnosis Date  . Anemia   . Hypertension     There are no active problems to display for this patient.   Past Surgical History:  Procedure Laterality Date  . CESAREAN SECTION    . HIP SURGERY Bilateral      OB History    Gravida  1   Para  1   Term  1   Preterm      AB      Living  1     SAB      TAB      Ectopic      Multiple      Live Births  1            Home Medications    Prior to Admission medications   Medication Sig Start Date End Date Taking? Authorizing Provider  diphenhydramine-acetaminophen (TYLENOL PM) 25-500 MG TABS tablet Take 3 tablets by mouth at bedtime as needed.    [provider]  ferrous sulfate 325 (65 FE) MG tablet Take 1 tablet (325 mg total) by mouth 2 (two) times daily with a meal. 09/04/13   Cherrie DistanceSanford, Frances, PA-C  hydrochlorothiazide (HYDRODIURIL) 25 MG tablet TK 1 T PO  QD 09/10/17   [provider]  ibuprofen (ADVIL,MOTRIN) 800 MG tablet Take 1 tablet (800 mg total) by mouth every 8 (eight) hours as needed. 10/04/17   Brock BadHarper,  Charles A, MD  medroxyPROGESTERone (PROVERA) 10 MG tablet Take 2 tablets (20 mg total) by mouth daily. 10/20/17   Brock BadHarper, Charles A, MD  Norethindrone-Ethinyl Estradiol-Fe Biphas (LO LOESTRIN FE) 1 MG-10 MCG / 10 MCG tablet Take 1 tablet by mouth daily. Start taking pills on first day of period. 10/20/17   Brock BadHarper, Charles A, MD  Prenat-FeFmCb-DSS-FA-DHA w/o A (CITRANATAL HARMONY) 27-1-260 MG CAPS Take 1 capsule by mouth daily before breakfast. 10/20/17   Brock BadHarper, Charles A, MD    Family History Family History  Problem Relation Age of Onset  . Hypertension Mother   . Congestive Heart Failure Mother   . Hypertension Father   . Congestive Heart Failure Father     Social History Social History   Tobacco Use  . Smoking status: Never Smoker  . Smokeless tobacco: Never Used  Substance Use Topics  . Alcohol use: No    Alcohol/week: 0.0 oz  . Drug use: No     Allergies   Patient has no known allergies.   Review of Systems Review of Systems  All other systems reviewed and are negative.    Physical Exam Updated  Vital Signs BP (!) 142/126 (BP Location: Left Wrist)   Pulse (!) 117   Temp 98.3 F (36.8 C) (Oral)   Resp 15   Ht 1.676 m (5\' 6" )   Wt (!) 204.1 kg (450 lb)   LMP 02/06/2018 (Approximate)   SpO2 98%   BMI 72.63 kg/m   Physical Exam  Constitutional: She is oriented to person, place, and time. She appears well-developed and well-nourished.  Non-toxic appearance. No distress.  HENT:  Head: Normocephalic and atraumatic.  Eyes: Pupils are equal, round, and reactive to light. Conjunctivae, EOM and lids are normal.  Neck: Normal range of motion. Neck supple. No tracheal deviation present. No thyroid mass present.  Cardiovascular: Normal rate, regular rhythm and normal heart sounds. Exam reveals no gallop.  No murmur heard. Pulmonary/Chest: Effort normal and breath sounds normal. No stridor. No respiratory distress. She has no decreased breath sounds. She has no  wheezes. She has no rhonchi. She has no rales.  Abdominal: Soft. Normal appearance and bowel sounds are normal. She exhibits no distension. There is no tenderness. There is no rebound and no CVA tenderness.  Musculoskeletal: Normal range of motion. She exhibits no edema or tenderness.  Neurological: She is alert and oriented to person, place, and time. She has normal strength. No cranial nerve deficit or sensory deficit. Coordination and gait normal. GCS eye subscore is 4. GCS verbal subscore is 5. GCS motor subscore is 6.  Skin: Skin is warm and dry. No abrasion and no rash noted.  Psychiatric: She has a normal mood and affect. Her speech is normal and behavior is normal.  Nursing note and vitals reviewed.    ED Treatments / Results  Labs (all labs ordered are listed, but only abnormal results are displayed) Labs Reviewed  CBC WITH DIFFERENTIAL/PLATELET  COMPREHENSIVE METABOLIC PANEL    EKG None  Radiology No results found.  Procedures Procedures (including critical care time)  Medications Ordered in ED Medications  0.9 %  sodium chloride infusion (has no administration in time range)  metoCLOPramide (REGLAN) injection 5 mg (has no administration in time range)  diphenhydrAMINE (BENADRYL) injection 12.5 mg (has no administration in time range)     Initial Impression / Assessment and Plan / ED Course  I have reviewed the triage vital signs and the nursing notes.  Pertinent labs & imaging results that were available during my care of the patient were reviewed by me and considered in my medical decision making (see chart for details).     Patient had a head CT that was negative.  Mild hyperkalemia noted at 3.2 will treat with oral potassium.  Was given IV fluids as well as Reglan and does feel better.  Patient's LFTs were elevated and she will be told to avoid Tylenol and follow-up with her doctor.  Will recheck vital signs prior to discharge.  Patient will be prescribed meds  for her migraine and will be encouraged to follow-up with her doctor  Final Clinical Impressions(s) / ED Diagnoses   Final diagnoses:  None    ED Discharge Orders    None       Lorre Nick, MD 02/27/18 1024    Lorre Nick, MD 02/27/18 1025

## 2018-02-27 NOTE — ED Notes (Signed)
Pt stated "I'm only supposed to take 1 b/p pill a day but because of the h/a's, I've been taking 2-3 more a day."

## 2018-02-27 NOTE — ED Notes (Signed)
Patient transported to CT 

## 2018-02-27 NOTE — Discharge Instructions (Addendum)
Your liver function tests were slightly elevated today.  Avoid Tylenol and alcohol.  Follow-up with your doctor for this.  Take your Reglan as directed for your headaches

## 2018-02-27 NOTE — ED Notes (Signed)
Pt returned from CT. IV team at bedside.  ?

## 2018-12-10 ENCOUNTER — Other Ambulatory Visit: Payer: Self-pay | Admitting: Obstetrics

## 2018-12-10 DIAGNOSIS — Z30011 Encounter for initial prescription of contraceptive pills: Secondary | ICD-10-CM

## 2019-01-11 IMAGING — US US PELVIS COMPLETE TRANSABD/TRANSVAG
1 series · 15 of 25 positions shown · non-contrast
Comparison: None

CLINICAL DATA: Abnormal uterine bleeding, heavy cycles

EXAM:
TRANSABDOMINAL AND TRANSVAGINAL ULTRASOUND OF PELVIS
TECHNIQUE: Both transabdominal and transvaginal ultrasound examinations of the
pelvis were performed. Transabdominal technique was performed for
global imaging of the pelvis including uterus, ovaries, adnexal
regions, and pelvic cul-de-sac. It was necessary to proceed with
endovaginal exam following the transabdominal exam to visualize the
uterus, endometrium, ovaries and adnexa .

[Series 1: us pelvis complete transabd/transvag · 15 of 60 slices shown]
[im 1/60]
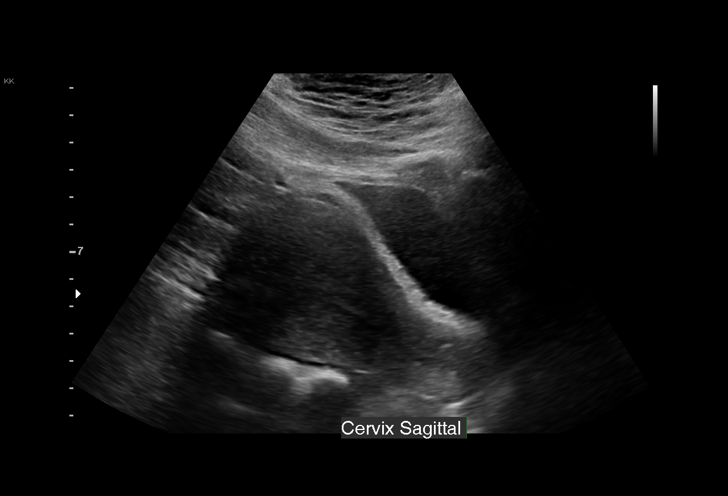
[im 5/60]
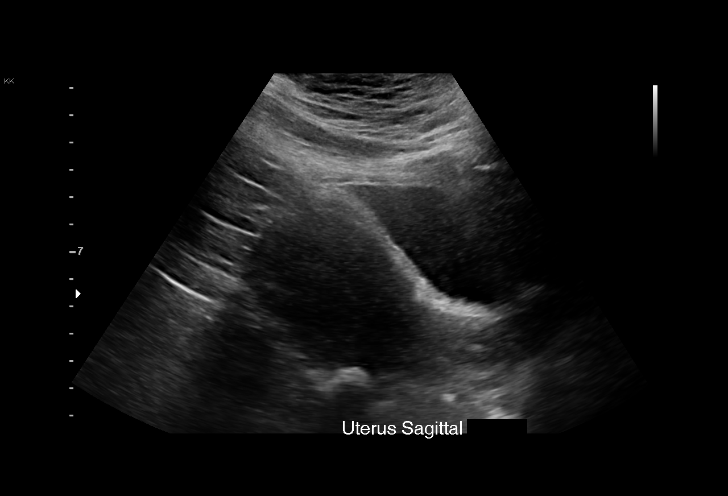
[im 10/60]
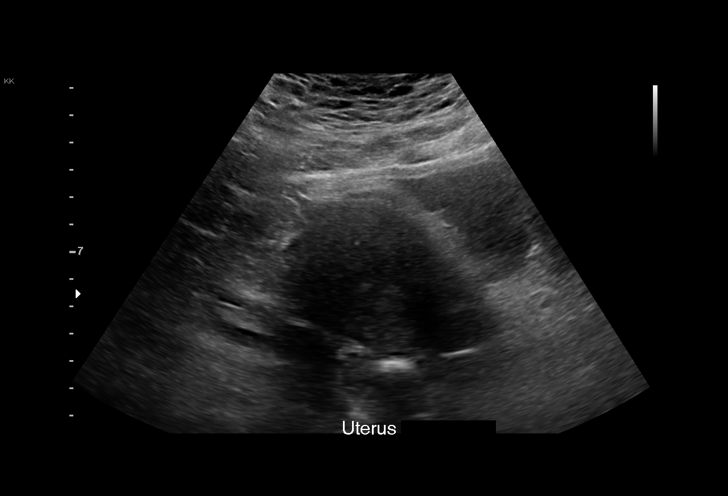
[im 13/60]
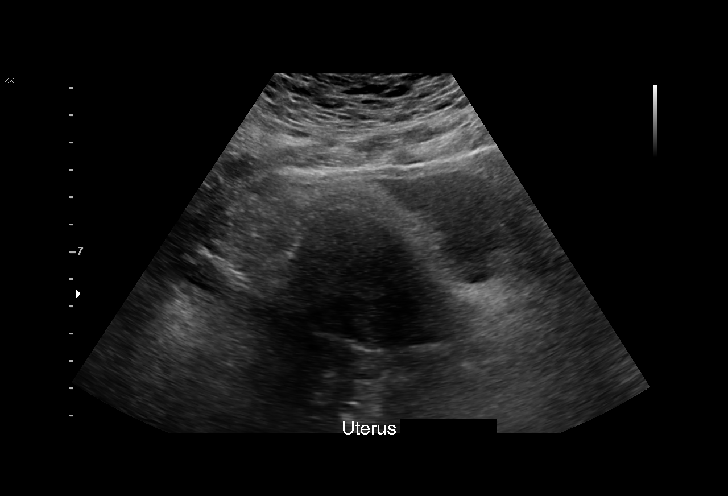
[im 18/60]
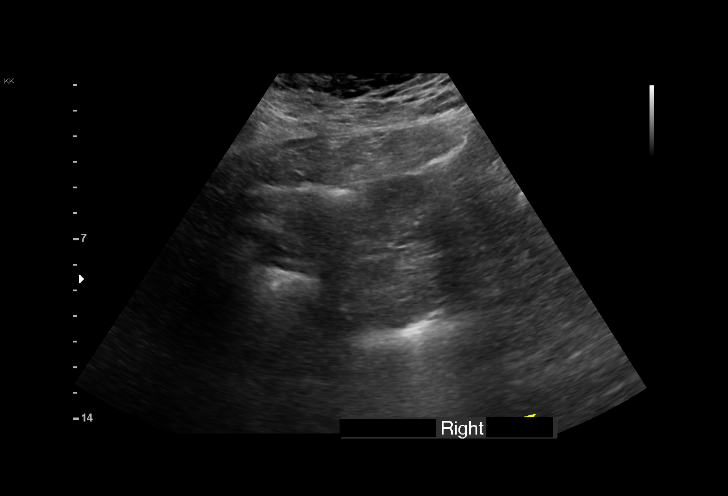
[im 23/60]
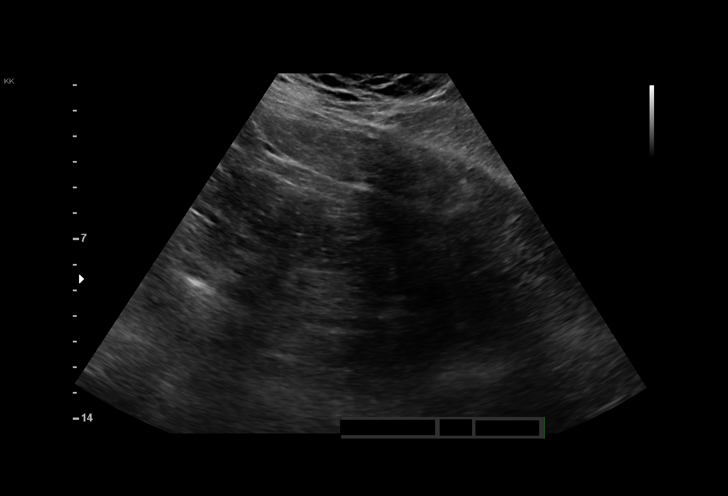
[im 25/60]
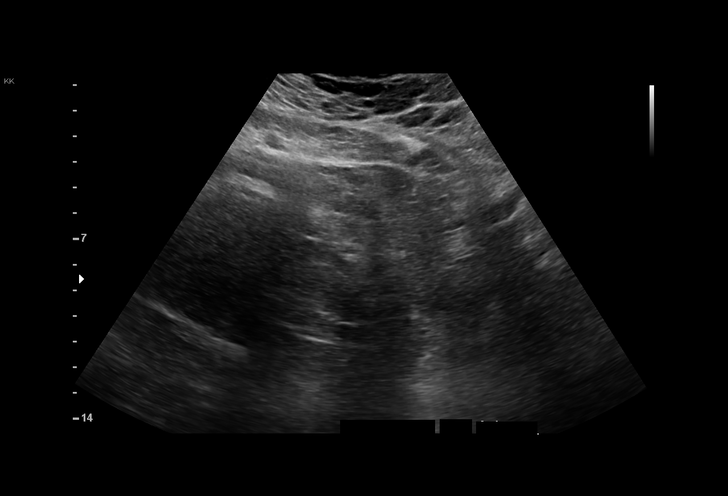
[im 30/60]
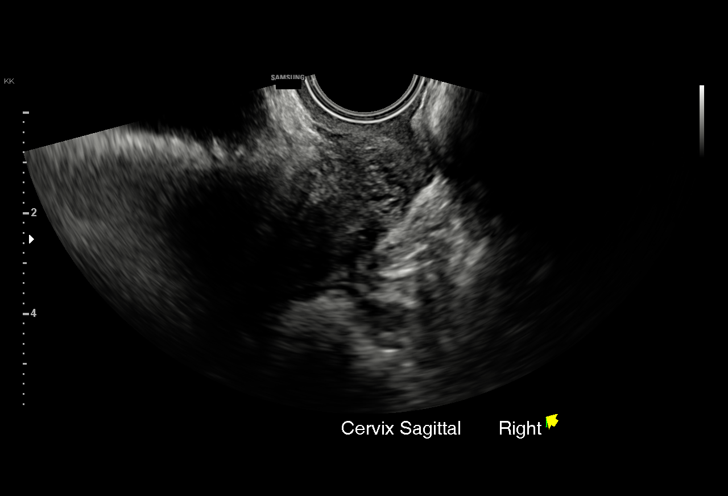
[im 35/60]
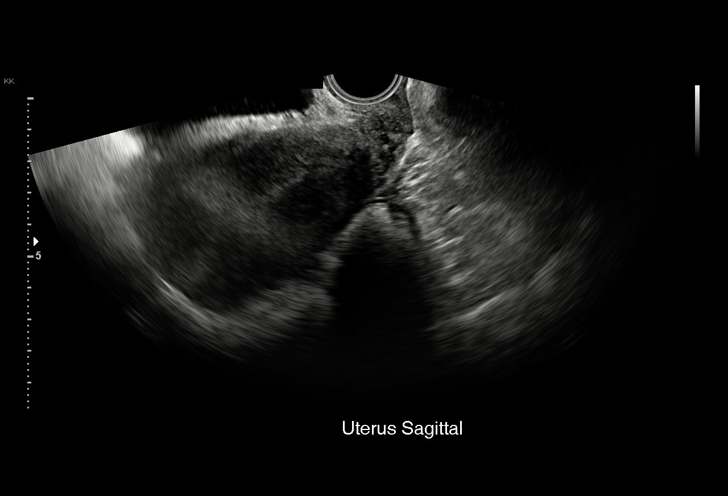
[im 37/60]
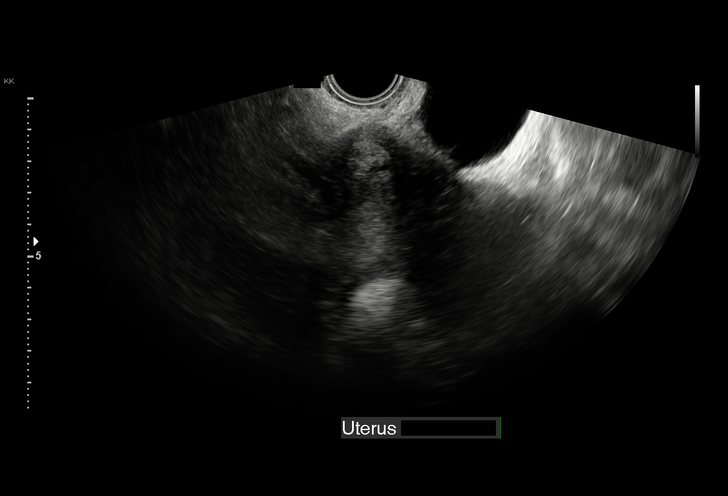
[im 42/60]
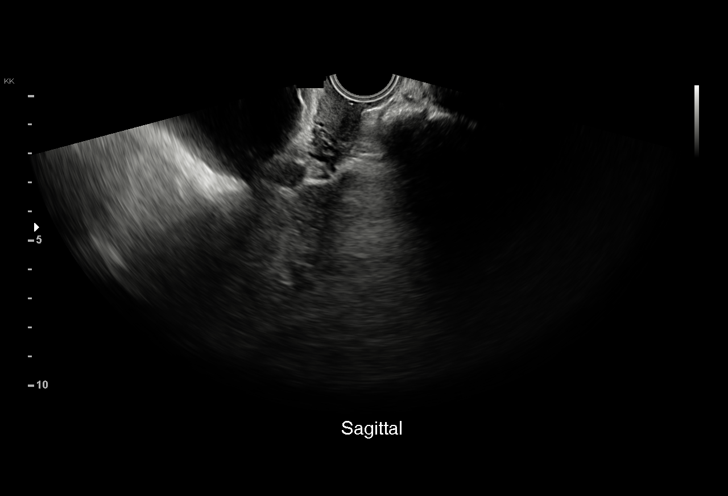
[im 47/60]
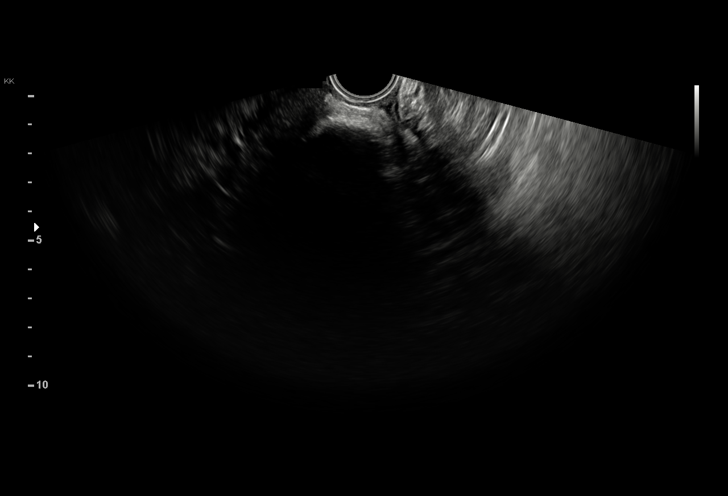
[im 50/60]
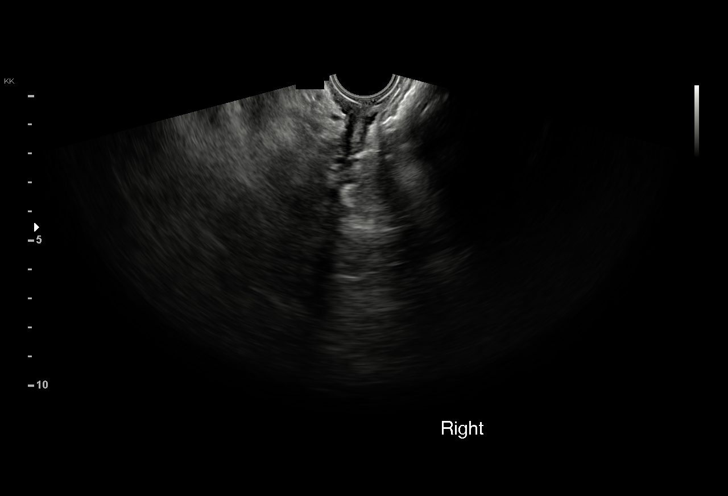
[im 55/60]
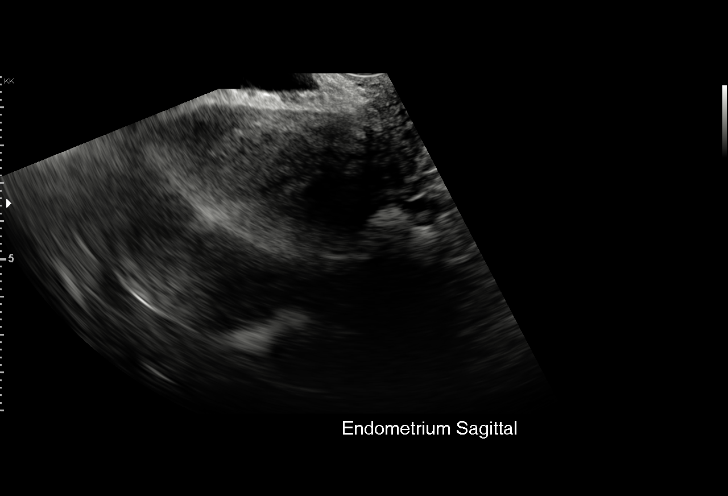
[im 60/60]
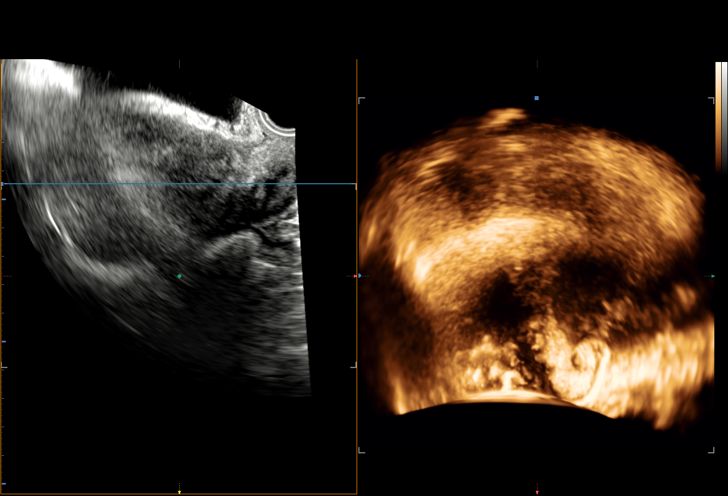

[15 of 25 positions shown; findings below may reference images not displayed]

FINDINGS: Uterus

Measurements: 9.8 x 5.2 x 6.2 cm. No fibroids or other mass
visualized.

Endometrium

Thickness: Normal thickness, 8 mm.  No focal abnormality visualized.

Right ovary

Measurements: Not visualized. No adnexal mass seen.

Left ovary

Measurements: Not visualized. No adnexal mass seen.

Other findings

No abnormal free fluid.
IMPRESSION: Unremarkable pelvic ultrasound. Ovaries not visible. If bleeding
remains unresponsive to hormonal or medical therapy, sonohysterogram
should be considered for focal lesion work-up. (Ref: Radiological
Reasoning: Algorithmic Workup of Abnormal Vaginal Bleeding with
Endovaginal Sonography and Sonohysterography. AJR 7112; 191:S68-73)

## 2019-12-17 ENCOUNTER — Other Ambulatory Visit: Payer: Medicaid Other

## 2024-06-22 ENCOUNTER — Other Ambulatory Visit: Payer: Self-pay

## 2024-06-22 ENCOUNTER — Emergency Department (HOSPITAL_COMMUNITY): Payer: Self-pay

## 2024-06-22 ENCOUNTER — Encounter (HOSPITAL_COMMUNITY): Payer: Self-pay

## 2024-06-22 ENCOUNTER — Emergency Department (HOSPITAL_COMMUNITY)
Admission: EM | Admit: 2024-06-22 | Discharge: 2024-06-23 | Discharge status: Against Medical Advice | Payer: Self-pay | Attending: Emergency Medicine | Admitting: Emergency Medicine

## 2024-06-22 DIAGNOSIS — R110 Nausea: Secondary | ICD-10-CM | POA: Insufficient documentation

## 2024-06-22 DIAGNOSIS — R0602 Shortness of breath: Secondary | ICD-10-CM | POA: Diagnosis not present

## 2024-06-22 DIAGNOSIS — R079 Chest pain, unspecified: Secondary | ICD-10-CM | POA: Insufficient documentation

## 2024-06-22 DIAGNOSIS — Z5321 Procedure and treatment not carried out due to patient leaving prior to being seen by health care provider: Secondary | ICD-10-CM | POA: Diagnosis not present

## 2024-06-22 LAB — CBC WITH DIFFERENTIAL/PLATELET
Basophils Absolute: 0.1 K/uL (ref 0.0–0.1)
Basophils Relative: 2 %
Eosinophils Absolute: 0.2 K/uL (ref 0.0–0.5)
Eosinophils Relative: 3 %
HCT: 30.8 % — ABNORMAL LOW (ref 36.0–46.0)
Hemoglobin: 8.6 g/dL — ABNORMAL LOW (ref 12.0–15.0)
Lymphocytes Relative: 22 %
Lymphs Abs: 1.5 K/uL (ref 0.7–4.0)
MCH: 18.3 pg — ABNORMAL LOW (ref 26.0–34.0)
MCHC: 27.9 g/dL — ABNORMAL LOW (ref 30.0–36.0)
MCV: 65.4 fL — ABNORMAL LOW (ref 80.0–100.0)
Monocytes Absolute: 0.3 K/uL (ref 0.1–1.0)
Monocytes Relative: 5 %
Neutro Abs: 4.6 K/uL (ref 1.7–7.7)
Neutrophils Relative %: 68 %
Platelets: 479 K/uL — ABNORMAL HIGH (ref 150–400)
RBC: 4.71 MIL/uL (ref 3.87–5.11)
RDW: 23.4 % — ABNORMAL HIGH (ref 11.5–15.5)
WBC: 6.7 K/uL (ref 4.0–10.5)
nRBC: 0 % (ref 0.0–0.2)

## 2024-06-22 LAB — HCG, SERUM, QUALITATIVE: Preg, Serum: NEGATIVE

## 2024-06-22 LAB — BASIC METABOLIC PANEL WITH GFR
Anion gap: 12 (ref 5–15)
BUN: 11 mg/dL (ref 6–20)
CO2: 22 mmol/L (ref 22–32)
Calcium: 9.3 mg/dL (ref 8.9–10.3)
Chloride: 105 mmol/L (ref 98–111)
Creatinine, Ser: 0.77 mg/dL (ref 0.44–1.00)
GFR, Estimated: >60 mL/min
Glucose, Bld: 100 mg/dL — ABNORMAL HIGH (ref 70–99)
Potassium: 3.7 mmol/L (ref 3.5–5.1)
Sodium: 139 mmol/L (ref 135–145)

## 2024-06-22 LAB — TROPONIN I (HIGH SENSITIVITY): Troponin I (High Sensitivity): 5 ng/L

## 2024-06-22 LAB — LIPASE, BLOOD: Lipase: 26 U/L (ref 11–51)

## 2024-06-22 MED ORDER — ONDANSETRON 4 MG PO TBDP: 4.0000 mg | ORAL_TABLET | Freq: Once | ORAL | Status: DC

## 2024-06-22 NOTE — ED Provider Triage Note (Signed)
 Emergency Medicine Provider Triage Evaluation Note  Monique Hunter , a 43 y.o. female  was evaluated in triage.  Pt complains of chest pain.  Patient reports about 2-hour history of centralized chest pain with some associated shortness of breath.  Has had some symptoms similar to this in the past but symptoms typically resolve on her own.  Reports that she has been out of her blood pressure medication for the last year.  Endorses some nausea but denies any chills, fever, diaphoresis, lightheadedness.  Review of Systems  Positive: As above Negative: As above  Physical Exam  BP (!) 149/94 (BP Location: Right Arm)   Pulse (!) 114   Temp 98.7 F (37.1 C) (Oral)   Resp 15   Ht 5' 6 (1.676 m)   Wt (!) 204.1 kg   SpO2 96%   BMI 72.63 kg/m  Gen:   Awake, tearful Resp:  Normal effort  MSK:   Moves extremities without difficulty  Other:    Medical Decision Making  Medically screening exam initiated at 6:37 PM.  Appropriate orders placed.  [name] was informed that the remainder of the evaluation will be completed by another provider, this initial triage assessment does not replace that evaluation, and the importance of remaining in the ED until their evaluation is complete.     [name], PA-C 06/22/24 1839

## 2024-06-22 NOTE — ED Triage Notes (Addendum)
 BIB ems with chest pain and shortness of breath.  Reports she has had this before and it goes away but didn't think time.  Also reports has not had BP meds in a year. Patient complains pain is central chest and epigastric pain.  +nauseas pain does not radiate anywhere.

## 2024-06-23 NOTE — ED Notes (Signed)
 Called pt x3, no answer.  KM
